# Patient Record
Sex: Female | Born: 1975 | Race: Black or African American | Hispanic: No | Marital: Single | Smoking: Never smoker
Health system: Southern US, Community
[De-identification: ages and names within clinical notes are randomized; demographics above are authoritative.]

## PROBLEM LIST (undated history)

## (undated) DIAGNOSIS — R569 Unspecified convulsions: Secondary | ICD-10-CM

## (undated) DIAGNOSIS — I10 Essential (primary) hypertension: Secondary | ICD-10-CM

## (undated) HISTORY — DX: Essential (primary) hypertension: I10

## (undated) HISTORY — DX: Unspecified convulsions: R56.9

---

## 1996-04-13 HISTORY — PX: TUBAL LIGATION: SHX77

## 2013-05-22 ENCOUNTER — Other Ambulatory Visit: Payer: Self-pay | Admitting: Neurology

## 2013-05-26 ENCOUNTER — Ambulatory Visit: Payer: Self-pay | Admitting: Nurse Practitioner

## 2013-09-21 ENCOUNTER — Encounter: Payer: Self-pay | Admitting: Nurse Practitioner

## 2013-09-22 ENCOUNTER — Encounter: Payer: Self-pay | Admitting: Nurse Practitioner

## 2013-09-22 ENCOUNTER — Encounter (INDEPENDENT_AMBULATORY_CARE_PROVIDER_SITE_OTHER): Payer: Self-pay

## 2013-09-22 ENCOUNTER — Ambulatory Visit (INDEPENDENT_AMBULATORY_CARE_PROVIDER_SITE_OTHER): Payer: Medicaid Other | Admitting: Nurse Practitioner

## 2013-09-22 VITALS — BP 163/102 | HR 86 | Ht 66.5 in | Wt 177.0 lb

## 2013-09-22 DIAGNOSIS — Z5181 Encounter for therapeutic drug level monitoring: Secondary | ICD-10-CM

## 2013-09-22 DIAGNOSIS — G40909 Epilepsy, unspecified, not intractable, without status epilepticus: Secondary | ICD-10-CM

## 2013-09-22 DIAGNOSIS — R569 Unspecified convulsions: Secondary | ICD-10-CM | POA: Insufficient documentation

## 2013-09-22 DIAGNOSIS — G40309 Generalized idiopathic epilepsy and epileptic syndromes, not intractable, without status epilepticus: Secondary | ICD-10-CM | POA: Insufficient documentation

## 2013-09-22 MED ORDER — OXCARBAZEPINE 300 MG PO TABS: 900.0000 mg | ORAL_TABLET | Freq: Two times a day (BID) | ORAL | Status: DC

## 2013-09-22 NOTE — Progress Notes (Signed)
[facility]  PATIENT: Samantha Pittman DOB: 1975/05/06   REASON FOR VISIT: follow up for seizure disorder   HISTORY OF PRESENT ILLNESS: Samantha Pittman, 38 year old female returns for followup. She was last seen and initially  evaluated by Samantha Pittman 04/22/2012. She has had one seizure event since that time. She claims she has been compliant with her medication. She is currently on Trileptal without side effects. She denies any falls or balance issues. She does have hypertension and was recently taken off of her medication. She is keeping a record of her B/P.  There is a strong family history of hypertension. She returns for reevaluation. She has no new neurologic complaints.  HISTORY: She had a history of epilepsy since 57 month old, also had a past medical history of hypertension, She had a generalized tonic-clonic seizure from 8 months to 9 years old, she was treated with phenobarbital then, because she was seizure-freesince 38 years of age, she was tapered off anti-leptic medication, until she has recurrent seizures again at age 61, she was treated with phenobarbital, later Dilantin, develops allergic reaction including rash and high fever, later she was treated with Tegretol for a few years,  about 4 years ago she was switched to Trileptal currently taking 300 mg 3 tablets twice a day, generic oxcarbazepine, she was under local neurologist care Samantha Pittman, who has since left the practice last visit was 2 years ago  She had only has very rare recurrent seizure, last seizure was 4 years ago, reported normal MRI of the brain, EEG, she is in school, lives with her children, drives,   REVIEW OF SYSTEMS: Full 14 system review of systems performed and notable only for those listed, all others are neg:  Constitutional: N/A  Cardiovascular: N/A  Ear/Nose/Throat: N/A  Skin: N/A  Eyes: N/A  Respiratory: N/A  Gastroitestinal: N/A  Hematology/Lymphatic: N/A  Endocrine:  N/A Musculoskeletal: Joint pain Allergy/Immunology: N/A  Neurological: 1 seizure Psychiatric: N/A Sleep : NA   ALLERGIES: Allergies  Allergen Reactions  . Dilantin [Phenytoin Sodium Extended]     HOME MEDICATIONS: Outpatient Prescriptions Prior to Visit  Medication Sig Dispense Refill  . Oxcarbazepine (TRILEPTAL) 300 MG tablet TAKE 3 TABLETS BY MOUTH TWICE DAILY  180 tablet  4  . lisinopril (PRINIVIL,ZESTRIL) 20 MG tablet Take 20 mg by mouth daily.       No facility-administered medications prior to visit.    PAST MEDICAL HISTORY: Past Medical History  Diagnosis Date  . High blood pressure     PAST SURGICAL HISTORY: Past Surgical History  Procedure Laterality Date  . Tubal ligation  1998    FAMILY HISTORY: Family History  Problem Relation Age of Onset  . Diabetes    . High blood pressure      SOCIAL HISTORY: History   Social History  . Marital Status: Single    Spouse Name: N/A    Number of Children: 2  . Years of Education: 12+   Occupational History  . unemployed     Social History Main Topics  . Smoking status: Never Smoker   . Smokeless tobacco: Never Used  . Alcohol Use: No  . Drug Use: No  . Sexual Activity: Not on file   Other Topics Concern  . Not on file   Social History Narrative   Patient lives at home with family.    Patient has a college education.   Patient has 2 children.      PHYSICAL EXAM  Filed  Vitals:   09/22/13 0920  BP: 163/102  Pulse: 86  Height: 5' 6.5" (1.689 m)  Weight: 177 lb (80.287 kg)   Body mass index is 28.14 kg/(m^2).  Generalized: Well developed, in no acute distress  Head: normocephalic and atraumatic,. Oropharynx benign  Neck: Supple, no carotid bruits  Cardiac: Regular rate rhythm, no murmur  Musculoskeletal: No deformity   Neurological examination   Mentation: Alert oriented to time, place, history taking. Follows all commands speech and language fluent  Cranial nerve II-XII: Fundoscopic  exam reveals sharp disc margins.Pupils were equal round reactive to light extraocular movements were full, visual field were full on confrontational test. Facial sensation and strength were normal. hearing was intact to finger rubbing bilaterally. Uvula tongue midline. head turning and shoulder shrug were normal and symmetric.Tongue protrusion into cheek strength was normal. Motor: normal bulk and tone, full strength in the BUE, BLE,  No focal weakness Sensory: normal and symmetric to light touch, pinprick, and  vibration  Coordination: finger-nose-finger, heel-to-shin bilaterally, no dysmetria Reflexes: Brachioradialis 2/2, biceps 2/2, triceps 2/2, patellar 2/2, Achilles 2/2, plantar responses were flexor bilaterally. Gait and Station: Rising up from seated position without assistance, normal stance,  moderate stride, good arm swing, smooth turning, able to perform tiptoe, and heel walking without difficulty. Tandem gait is steady  DIAGNOSTIC DATA (LABS, IMAGING, TESTING) -   ASSESSMENT AND PLAN  38 y.o. year old female  has a past medical history of High blood pressure. and generalized seizure disorder here to followup. She has had one seizure in the last 18 months. She has been compliant with her medication  Continue Trileptal at current dose will refill Check with Primary care about elevated blood pressure Trough level Trileptal  next week along with CBC, CMP Followup yearly and when necessary Call for any seizure activity Samantha Pittman, [facility], [facility], [facility] [address] [place], [place] [address] [phone]

## 2013-09-22 NOTE — Patient Instructions (Signed)
Continue Trileptal at current dose will refill Check with Primary care about elevated blood pressure Trough level last next week Followup yearly and when necessary Call for any seizure activity

## 2014-09-24 ENCOUNTER — Ambulatory Visit: Payer: Medicaid Other | Admitting: Nurse Practitioner

## 2014-10-04 ENCOUNTER — Ambulatory Visit (INDEPENDENT_AMBULATORY_CARE_PROVIDER_SITE_OTHER): Payer: Medicaid Other | Admitting: Nurse Practitioner

## 2014-10-04 ENCOUNTER — Encounter: Payer: Self-pay | Admitting: Nurse Practitioner

## 2014-10-04 ENCOUNTER — Telehealth: Payer: Self-pay | Admitting: *Deleted

## 2014-10-04 VITALS — BP 192/132 | HR 96 | Ht 66.0 in | Wt 185.2 lb

## 2014-10-04 DIAGNOSIS — G40309 Generalized idiopathic epilepsy and epileptic syndromes, not intractable, without status epilepticus: Secondary | ICD-10-CM

## 2014-10-04 DIAGNOSIS — Z5181 Encounter for therapeutic drug level monitoring: Secondary | ICD-10-CM

## 2014-10-04 MED ORDER — OXCARBAZEPINE 300 MG PO TABS: 900.0000 mg | ORAL_TABLET | Freq: Two times a day (BID) | ORAL | Status: DC

## 2014-10-04 NOTE — Patient Instructions (Signed)
Continue Trileptal at current dose will refill Check labs today Follow-up with primary care about blood pressure Follow-up yearly and when necessary Call for any seizure activity

## 2014-10-04 NOTE — Progress Notes (Signed)
[facility]  PATIENT: Samantha Pittman DOB: Jan 05, 1976   REASON FOR VISIT: Follow-up for seizure disorder  HISTORY FROM: Patient    HISTORY OF PRESENT ILLNESS:Ms. 78, 39 year old female returns for followup. She was last seen 09/22/2013 . No seizure events since last seen. She claims she is compliant with her medication. She did not get her labs after her last visit. Her blood pressure is noted to be elevated today and she says she has been off of her blood pressure medicine for about a year. She does have a history of hypertension She denies any headaches  She is currently on Trileptal without side effects. She denies any falls or balance issues.  She returns for reevaluation. She has no new neurologic complaints.  HISTORY: She had a history of epilepsy since 2 month old, also had a past medical history of hypertension, She had a generalized tonic-clonic seizure from 8 months to 69 years old, she was treated with phenobarbital then, because she was seizure-freesince 39 years of age, she was tapered off anti-leptic medication, until she has recurrent seizures again at age 57, she was treated with phenobarbital, later Dilantin, develops allergic reaction including rash and high fever, later she was treated with Tegretol for a few years,  about 4 years ago she was switched to Trileptal currently taking 300 mg 3 tablets twice a day, generic oxcarbazepine, she was under local neurologist care doctor Tesefay, who has since left the practice last visit was 2 years ago  She had only has very rare recurrent seizure, last seizure was 4 years ago, reported normal MRI of the brain, EEG, she is in school, lives with her children, drives,   REVIEW OF SYSTEMS: Full 14 system review of systems performed and notable only for those listed, all others are neg:  Constitutional: neg  Cardiovascular: Leg swelling  Ear/Nose/Throat: neg  Skin: neg Eyes: neg Respiratory:  neg Gastroitestinal: neg  Hematology/Lymphatic: neg  Endocrine: neg Musculoskeletal: Joint pain Allergy/Immunology: neg Neurological: neg Psychiatric: neg Sleep : neg   ALLERGIES: Allergies  Allergen Reactions  . Dilantin [Phenytoin Sodium Extended]     HOME MEDICATIONS: Outpatient Prescriptions Prior to Visit  Medication Sig Dispense Refill  . Oxcarbazepine (TRILEPTAL) 300 MG tablet Take 3 tablets (900 mg total) by mouth 2 (two) times daily. 180 tablet 11   No facility-administered medications prior to visit.    PAST MEDICAL HISTORY: Past Medical History  Diagnosis Date  . High blood pressure   . Seizures     PAST SURGICAL HISTORY: Past Surgical History  Procedure Laterality Date  . Tubal ligation  1998    FAMILY HISTORY: Family History  Problem Relation Age of Onset  . Diabetes    . High blood pressure      SOCIAL HISTORY: History   Social History  . Marital Status: Single    Spouse Name: N/A  . Number of Children: 2  . Years of Education: 12+   Occupational History  . unemployed     Social History Main Topics  . Smoking status: Never Smoker   . Smokeless tobacco: Never Used  . Alcohol Use: No  . Drug Use: No  . Sexual Activity: Not on file   Other Topics Concern  . Not on file   Social History Narrative   Patient lives at home with family.    Patient has a college education.   Patient has 2 children.    Started new job, standing on cement.  10-04-14  PHYSICAL EXAM  Filed Vitals:   10/04/14 1003 10/04/14 1007  BP: 196/122 192/132  Pulse: 96   Height: 5\' 6"  (1.676 m)   Weight: 185 lb 3.2 oz (84.006 kg)    Body mass index is 29.91 kg/(m^2). Generalized: Well developed, in no acute distress  Head: normocephalic and atraumatic,. Oropharynx benign  Neck: Supple, no carotid bruits  Cardiac: Regular rate rhythm, no murmur  Musculoskeletal: No deformity   Neurological examination   Mentation: Alert oriented to time, place,  history taking. Follows all commands speech and language fluent  Cranial nerve II-XII: Fundoscopic exam reveals sharp disc margins.Pupils were equal round reactive to light extraocular movements were full, visual field were full on confrontational test. Facial sensation and strength were normal. hearing was intact to finger rubbing bilaterally. Uvula tongue midline. head turning and shoulder shrug were normal and symmetric.Tongue protrusion into cheek strength was normal. Motor: normal bulk and tone, full strength in the BUE, BLE, No focal weakness Sensory: normal and symmetric to light touch, pinprick, and vibration  Coordination: finger-nose-finger, heel-to-shin bilaterally, no dysmetria Reflexes: Brachioradialis 2/2, biceps 2/2, triceps 2/2, patellar 2/2, Achilles 2/2, plantar responses were flexor bilaterally. Gait and Station: Rising up from seated position without assistance, normal stance, moderate stride, good arm swing, smooth turning, able to perform tiptoe, and heel walking without difficulty. Tandem gait is steady   DIAGNOSTIC DATA (LABS, IMAGING, TESTING) - ASSESSMENT AND PLAN  39 y.o. year old female  has a past medical history of High blood pressure and generalized seizure disorder here to follow-up. She is currently on Trileptal without additional seizures. Blood pressure elevated today in the office. Will send note to primary care  Continue Trileptal at current dose will refill Check labs today, CBC, BMP and Trileptal level Follow-up with primary care about blood pressure first reading 192/122, after 10 minutes 180/110 Follow-up yearly and when necessary Call for any seizure activity Dennie Bible, [facility], [facility], [facility] [address], [place], [place] [address] [phone]

## 2014-10-04 NOTE — Telephone Encounter (Signed)
I will forward note once finished to them [phone].  This is regarding elevated Bp.

## 2014-10-05 NOTE — Telephone Encounter (Signed)
I called and LMVM for pt on [phone] that did fax over last ofv note relating to Bp elevation.  Reiterated to call and make appt with them.

## 2014-10-05 NOTE — Telephone Encounter (Signed)
Faxed note to [facility].  [phone].

## 2014-10-07 LAB — BASIC METABOLIC PANEL
BUN / CREAT RATIO: 8 — AB (ref 9–23)
BUN: 8 mg/dL (ref 6–24)
CHLORIDE: 101 mmol/L (ref 97–108)
CO2: 25 mmol/L (ref 18–29)
Calcium: 9.8 mg/dL (ref 8.7–10.2)
Creatinine, Ser: 0.96 mg/dL (ref 0.57–1.00)
GFR calc Af Amer: 86 mL/min/{1.73_m2} (ref >59)
GFR calc non Af Amer: 74 mL/min/{1.73_m2} (ref >59)
GLUCOSE: 63 mg/dL — AB (ref 65–99)
Potassium: 4.8 mmol/L (ref 3.5–5.2)
Sodium: 140 mmol/L (ref 134–144)

## 2014-10-07 LAB — CBC WITH DIFFERENTIAL/PLATELET
BASOS ABS: 0.1 10*3/uL (ref 0.0–0.2)
Basos: 1 %
EOS (ABSOLUTE): 0.1 10*3/uL (ref 0.0–0.4)
Eos: 1 %
HEMATOCRIT: 39.2 % (ref 34.0–46.6)
Hemoglobin: 12.7 g/dL (ref 11.1–15.9)
IMMATURE GRANULOCYTES: 0 %
Immature Grans (Abs): 0 10*3/uL (ref 0.0–0.1)
LYMPHS: 42 %
Lymphocytes Absolute: 1.7 10*3/uL (ref 0.7–3.1)
MCH: 28.5 pg (ref 26.6–33.0)
MCHC: 32.4 g/dL (ref 31.5–35.7)
MCV: 88 fL (ref 79–97)
Monocytes Absolute: 0.3 10*3/uL (ref 0.1–0.9)
Monocytes: 8 %
NEUTROS PCT: 48 %
Neutrophils Absolute: 2 10*3/uL (ref 1.4–7.0)
Platelets: 281 10*3/uL (ref 150–379)
RBC: 4.45 x10E6/uL (ref 3.77–5.28)
RDW: 14.4 % (ref 12.3–15.4)
WBC: 4.1 10*3/uL (ref 3.4–10.8)

## 2014-10-07 LAB — 10-HYDROXYCARBAZEPINE: Oxcarbazepine SerPl-Mcnc: 45 ug/mL — ABNORMAL HIGH (ref 10–35)

## 2014-10-09 NOTE — Progress Notes (Signed)
I have reviewed and agreed above plan. 

## 2014-10-11 ENCOUNTER — Telehealth: Payer: Self-pay | Admitting: *Deleted

## 2014-10-11 NOTE — Progress Notes (Signed)
Quick Note:  I called and spoke to pt and relayed the results of her lab tests. She is aware took her medication prior to having labs drawn. No changes. She did make it to her pcp and I will fax results of these labs to them. She goes back in one month for recheck on her Bp meds. ______

## 2014-10-11 NOTE — Telephone Encounter (Signed)
Faxed to Dr. Wenda Overland the lab results.

## 2015-08-12 ENCOUNTER — Telehealth: Payer: Self-pay | Admitting: *Deleted

## 2015-08-12 MED ORDER — OXCARBAZEPINE 300 MG PO TABS: 900.0000 mg | ORAL_TABLET | Freq: Two times a day (BID) | ORAL | Status: DC

## 2015-08-12 NOTE — Telephone Encounter (Signed)
Pt has appt 10-04-15 with CM/ NP.  Refilled for 2 months.

## 2015-10-04 ENCOUNTER — Ambulatory Visit: Payer: Medicaid Other | Admitting: Nurse Practitioner

## 2015-10-07 ENCOUNTER — Encounter: Payer: Self-pay | Admitting: Nurse Practitioner

## 2015-11-14 ENCOUNTER — Other Ambulatory Visit: Payer: Self-pay | Admitting: Nurse Practitioner

## 2015-12-02 ENCOUNTER — Ambulatory Visit: Payer: Medicaid Other | Admitting: Nurse Practitioner

## 2016-02-11 ENCOUNTER — Inpatient Hospital Stay (HOSPITAL_COMMUNITY)
Admission: EM | Admit: 2016-02-11 | Discharge: 2016-03-13 | DRG: 064 | Disposition: E | Payer: Medicaid Other | Source: Other Acute Inpatient Hospital | Attending: Internal Medicine | Admitting: Internal Medicine

## 2016-02-11 ENCOUNTER — Inpatient Hospital Stay (HOSPITAL_COMMUNITY): Payer: Medicaid Other

## 2016-02-11 DIAGNOSIS — N179 Acute kidney failure, unspecified: Secondary | ICD-10-CM

## 2016-02-11 DIAGNOSIS — E875 Hyperkalemia: Secondary | ICD-10-CM | POA: Diagnosis not present

## 2016-02-11 DIAGNOSIS — R6521 Severe sepsis with septic shock: Secondary | ICD-10-CM | POA: Diagnosis present

## 2016-02-11 DIAGNOSIS — J9600 Acute respiratory failure, unspecified whether with hypoxia or hypercapnia: Secondary | ICD-10-CM | POA: Diagnosis present

## 2016-02-11 DIAGNOSIS — N170 Acute kidney failure with tubular necrosis: Secondary | ICD-10-CM | POA: Diagnosis present

## 2016-02-11 DIAGNOSIS — R402434 Glasgow coma scale score 3-8, 24 hours or more after hospital admission: Secondary | ICD-10-CM | POA: Diagnosis not present

## 2016-02-11 DIAGNOSIS — N63 Unspecified lump in unspecified breast: Secondary | ICD-10-CM

## 2016-02-11 DIAGNOSIS — I6340 Cerebral infarction due to embolism of unspecified cerebral artery: Secondary | ICD-10-CM | POA: Diagnosis present

## 2016-02-11 DIAGNOSIS — Z9911 Dependence on respirator [ventilator] status: Secondary | ICD-10-CM

## 2016-02-11 DIAGNOSIS — G9340 Encephalopathy, unspecified: Secondary | ICD-10-CM

## 2016-02-11 DIAGNOSIS — Z452 Encounter for adjustment and management of vascular access device: Secondary | ICD-10-CM

## 2016-02-11 DIAGNOSIS — I639 Cerebral infarction, unspecified: Secondary | ICD-10-CM

## 2016-02-11 DIAGNOSIS — R34 Anuria and oliguria: Secondary | ICD-10-CM | POA: Diagnosis not present

## 2016-02-11 DIAGNOSIS — G936 Cerebral edema: Secondary | ICD-10-CM | POA: Diagnosis not present

## 2016-02-11 DIAGNOSIS — I6783 Posterior reversible encephalopathy syndrome: Secondary | ICD-10-CM | POA: Diagnosis not present

## 2016-02-11 DIAGNOSIS — I161 Hypertensive emergency: Secondary | ICD-10-CM | POA: Diagnosis present

## 2016-02-11 DIAGNOSIS — R4020 Unspecified coma: Secondary | ICD-10-CM

## 2016-02-11 DIAGNOSIS — C50919 Malignant neoplasm of unspecified site of unspecified female breast: Secondary | ICD-10-CM

## 2016-02-11 DIAGNOSIS — G40909 Epilepsy, unspecified, not intractable, without status epilepticus: Secondary | ICD-10-CM | POA: Diagnosis present

## 2016-02-11 DIAGNOSIS — Z515 Encounter for palliative care: Secondary | ICD-10-CM | POA: Diagnosis not present

## 2016-02-11 DIAGNOSIS — R569 Unspecified convulsions: Secondary | ICD-10-CM | POA: Diagnosis present

## 2016-02-11 DIAGNOSIS — D638 Anemia in other chronic diseases classified elsewhere: Secondary | ICD-10-CM | POA: Diagnosis present

## 2016-02-11 DIAGNOSIS — I63039 Cerebral infarction due to thrombosis of unspecified carotid artery: Secondary | ICD-10-CM

## 2016-02-11 DIAGNOSIS — I6309 Cerebral infarction due to thrombosis of other precerebral artery: Secondary | ICD-10-CM

## 2016-02-11 DIAGNOSIS — I6300 Cerebral infarction due to thrombosis of unspecified precerebral artery: Secondary | ICD-10-CM | POA: Diagnosis present

## 2016-02-11 DIAGNOSIS — N186 End stage renal disease: Secondary | ICD-10-CM | POA: Diagnosis present

## 2016-02-11 DIAGNOSIS — Z978 Presence of other specified devices: Secondary | ICD-10-CM

## 2016-02-11 DIAGNOSIS — N1330 Unspecified hydronephrosis: Secondary | ICD-10-CM

## 2016-02-11 DIAGNOSIS — Z66 Do not resuscitate: Secondary | ICD-10-CM | POA: Diagnosis not present

## 2016-02-11 DIAGNOSIS — E872 Acidosis: Secondary | ICD-10-CM | POA: Diagnosis present

## 2016-02-11 DIAGNOSIS — A409 Streptococcal sepsis, unspecified: Secondary | ICD-10-CM | POA: Diagnosis not present

## 2016-02-11 DIAGNOSIS — I674 Hypertensive encephalopathy: Secondary | ICD-10-CM | POA: Diagnosis present

## 2016-02-11 DIAGNOSIS — I959 Hypotension, unspecified: Secondary | ICD-10-CM | POA: Diagnosis not present

## 2016-02-11 DIAGNOSIS — I248 Other forms of acute ischemic heart disease: Secondary | ICD-10-CM | POA: Diagnosis not present

## 2016-02-11 DIAGNOSIS — C7931 Secondary malignant neoplasm of brain: Secondary | ICD-10-CM | POA: Diagnosis not present

## 2016-02-11 DIAGNOSIS — I6529 Occlusion and stenosis of unspecified carotid artery: Secondary | ICD-10-CM | POA: Diagnosis not present

## 2016-02-11 DIAGNOSIS — I120 Hypertensive chronic kidney disease with stage 5 chronic kidney disease or end stage renal disease: Secondary | ICD-10-CM | POA: Diagnosis not present

## 2016-02-11 DIAGNOSIS — D696 Thrombocytopenia, unspecified: Secondary | ICD-10-CM | POA: Diagnosis present

## 2016-02-11 DIAGNOSIS — J9601 Acute respiratory failure with hypoxia: Secondary | ICD-10-CM

## 2016-02-11 DIAGNOSIS — C50812 Malignant neoplasm of overlapping sites of left female breast: Secondary | ICD-10-CM | POA: Diagnosis not present

## 2016-02-11 LAB — CBC WITH DIFFERENTIAL/PLATELET
Basophils Absolute: 0 10*3/uL (ref 0.0–0.1)
Basophils Relative: 0 %
EOS ABS: 0 10*3/uL (ref 0.0–0.7)
Eosinophils Relative: 0 %
HEMATOCRIT: 24.7 % — AB (ref 36.0–46.0)
HEMOGLOBIN: 8.2 g/dL — AB (ref 12.0–15.0)
LYMPHS ABS: 0.8 10*3/uL (ref 0.7–4.0)
Lymphocytes Relative: 5 %
MCH: 29.5 pg (ref 26.0–34.0)
MCHC: 33.2 g/dL (ref 30.0–36.0)
MCV: 88.8 fL (ref 78.0–100.0)
Monocytes Absolute: 0.8 10*3/uL (ref 0.1–1.0)
Monocytes Relative: 5 %
NEUTROS ABS: 15.3 10*3/uL — AB (ref 1.7–7.7)
NEUTROS PCT: 90 %
Platelets: 130 10*3/uL — ABNORMAL LOW (ref 150–400)
RBC: 2.78 MIL/uL — AB (ref 3.87–5.11)
RDW: 16.6 % — ABNORMAL HIGH (ref 11.5–15.5)
WBC: 16.8 10*3/uL — AB (ref 4.0–10.5)

## 2016-02-11 LAB — DIC (DISSEMINATED INTRAVASCULAR COAGULATION)PANEL
Platelets: 133 10*3/uL — ABNORMAL LOW (ref 150–400)
aPTT: 31 seconds (ref 24–36)

## 2016-02-11 LAB — COMPREHENSIVE METABOLIC PANEL
ALBUMIN: 3.1 g/dL — AB (ref 3.5–5.0)
ALT: 18 U/L (ref 14–54)
AST: 29 U/L (ref 15–41)
Alkaline Phosphatase: 100 U/L (ref 38–126)
Anion gap: 14 (ref 5–15)
BUN: 56 mg/dL — AB (ref 6–20)
CHLORIDE: 104 mmol/L (ref 101–111)
CO2: 20 mmol/L — AB (ref 22–32)
CREATININE: 6.47 mg/dL — AB (ref 0.44–1.00)
Calcium: 8.3 mg/dL — ABNORMAL LOW (ref 8.9–10.3)
GFR calc Af Amer: 8 mL/min — ABNORMAL LOW (ref >60)
GFR calc non Af Amer: 7 mL/min — ABNORMAL LOW (ref >60)
GLUCOSE: 136 mg/dL — AB (ref 65–99)
POTASSIUM: 4.1 mmol/L (ref 3.5–5.1)
SODIUM: 138 mmol/L (ref 135–145)
Total Bilirubin: 0.4 mg/dL (ref 0.3–1.2)
Total Protein: 6.3 g/dL — ABNORMAL LOW (ref 6.5–8.1)

## 2016-02-11 LAB — DIC (DISSEMINATED INTRAVASCULAR COAGULATION) PANEL
D DIMER QUANT: 3.1 ug{FEU}/mL — AB (ref 0.00–0.50)
FIBRINOGEN: 492 mg/dL — AB (ref 210–475)
INR: 1.17
PROTHROMBIN TIME: 15 s (ref 11.4–15.2)

## 2016-02-11 LAB — BLOOD GAS, ARTERIAL
ACID-BASE DEFICIT: 6.4 mmol/L — AB (ref 0.0–2.0)
BICARBONATE: 17.6 mmol/L — AB (ref 20.0–28.0)
Drawn by: 39899
FIO2: 40
LHR: 12 {breaths}/min
MECHVT: 400 mL
O2 SAT: 99.4 %
PEEP/CPAP: 5 cmH2O
Patient temperature: 98.6
pCO2 arterial: 29.6 mmHg — ABNORMAL LOW (ref 32.0–48.0)
pH, Arterial: 7.392 (ref 7.350–7.450)
pO2, Arterial: 169 mmHg — ABNORMAL HIGH (ref 83.0–108.0)

## 2016-02-11 LAB — TROPONIN I
Troponin I: 1.22 ng/mL (ref <0.03)
Troponin I: 1.88 ng/mL (ref <0.03)

## 2016-02-11 LAB — PROTIME-INR
INR: 1.22
PROTHROMBIN TIME: 15.5 s — AB (ref 11.4–15.2)

## 2016-02-11 LAB — LACTATE DEHYDROGENASE: LDH: 631 U/L — ABNORMAL HIGH (ref 98–192)

## 2016-02-11 LAB — BRAIN NATRIURETIC PEPTIDE: B Natriuretic Peptide: 110.2 pg/mL — ABNORMAL HIGH (ref 0.0–100.0)

## 2016-02-11 LAB — SAVE SMEAR

## 2016-02-11 LAB — PHOSPHORUS: Phosphorus: 6.7 mg/dL — ABNORMAL HIGH (ref 2.5–4.6)

## 2016-02-11 LAB — MAGNESIUM: MAGNESIUM: 2.3 mg/dL (ref 1.7–2.4)

## 2016-02-11 LAB — MRSA PCR SCREENING: MRSA by PCR: NEGATIVE

## 2016-02-11 MED ORDER — ORAL CARE MOUTH RINSE: 15.0000 mL | Freq: Two times a day (BID) | OROMUCOSAL | Status: DC

## 2016-02-11 MED ORDER — ALBUTEROL SULFATE (2.5 MG/3ML) 0.083% IN NEBU: 2.5000 mg | INHALATION_SOLUTION | RESPIRATORY_TRACT | Status: DC | PRN

## 2016-02-11 MED ORDER — ORAL CARE MOUTH RINSE
15.0000 mL | OROMUCOSAL | Status: DC
  Administered 2016-02-11 – 2016-02-22 (×104): 15 mL via OROMUCOSAL

## 2016-02-11 MED ORDER — MIDAZOLAM HCL 2 MG/2ML IJ SOLN: 2.0000 mg | INTRAMUSCULAR | Status: DC | PRN

## 2016-02-11 MED ORDER — ASPIRIN 300 MG RE SUPP: 300.0000 mg | RECTAL | Status: AC

## 2016-02-11 MED ORDER — SODIUM CHLORIDE 0.9 % IV SOLN
INTRAVENOUS | Status: DC
  Administered 2016-02-11 – 2016-02-20 (×3): via INTRAVENOUS

## 2016-02-11 MED ORDER — CHLORHEXIDINE GLUCONATE 0.12 % MT SOLN
15.0000 mL | Freq: Two times a day (BID) | OROMUCOSAL | Status: DC
  Administered 2016-02-11 – 2016-02-22 (×22): 15 mL via OROMUCOSAL
  Filled 2016-02-11 (×3): qty 15

## 2016-02-11 MED ORDER — SODIUM CHLORIDE 0.9 % IV SOLN: 250.0000 mL | INTRAVENOUS | Status: DC | PRN

## 2016-02-11 MED ORDER — OXCARBAZEPINE 300 MG/5ML PO SUSP
900.0000 mg | Freq: Two times a day (BID) | ORAL | Status: DC
  Administered 2016-02-11 – 2016-02-22 (×22): 900 mg
  Filled 2016-02-11 (×24): qty 15

## 2016-02-11 MED ORDER — ASPIRIN 81 MG PO CHEW
324.0000 mg | CHEWABLE_TABLET | ORAL | Status: AC
  Administered 2016-02-11: 324 mg via ORAL
  Filled 2016-02-11: qty 4

## 2016-02-11 MED ORDER — FENTANYL CITRATE (PF) 100 MCG/2ML IJ SOLN: 100.0000 ug | INTRAMUSCULAR | Status: DC | PRN

## 2016-02-11 MED ORDER — ONDANSETRON HCL 4 MG/2ML IJ SOLN
4.0000 mg | Freq: Four times a day (QID) | INTRAMUSCULAR | Status: DC | PRN
Start: 2016-02-11 — End: 2016-02-13

## 2016-02-11 MED ORDER — NOREPINEPHRINE BITARTRATE 1 MG/ML IV SOLN
0.0000 ug/min | INTRAVENOUS | Status: DC
  Filled 2016-02-11: qty 4

## 2016-02-11 MED ORDER — ACETAMINOPHEN 325 MG PO TABS
650.0000 mg | ORAL_TABLET | ORAL | Status: DC | PRN
  Administered 2016-02-13 – 2016-02-14 (×2): 650 mg via ORAL
  Filled 2016-02-11 (×2): qty 2

## 2016-02-11 MED ORDER — FAMOTIDINE 20 MG PO TABS
20.0000 mg | ORAL_TABLET | Freq: Two times a day (BID) | ORAL | Status: DC
  Administered 2016-02-11 – 2016-02-12 (×2): 20 mg via ORAL
  Filled 2016-02-11 (×2): qty 1

## 2016-02-11 MED ORDER — CHLORHEXIDINE GLUCONATE 0.12% ORAL RINSE (MEDLINE KIT): 15.0000 mL | Freq: Two times a day (BID) | OROMUCOSAL | Status: DC

## 2016-02-11 NOTE — Progress Notes (Signed)
RT will not be able to help transport the pt to MRI due to significant staffing issues tonight. Planning for dayshift RT to help get pt to MRI tomorrow aroung 9/10am depending on MRI workload.

## 2016-02-11 NOTE — Consult Note (Signed)
NEURO HOSPITALIST CONSULT NOTE   Requestig physician: Dr. Titus Mould   Reason for Consult: subacute infract on [facility] and [facility]   History obtained from:  Chart and Family at bedside  HPI:                                                                                                                                          Samantha Pittman is an 40 yo female with hx of seizures and HTN presented to outside hospital with hypertensive urgency. BP was lowered quickly and had a seizure. 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 She was intubated for airway protection and transferred to [facility] for further evaluation.  Past Medical History:  Diagnosis Date  . High blood pressure   . Seizures     Past Surgical History:  Procedure Laterality Date  . TUBAL LIGATION  1998    Family History  Problem Relation Age of Onset  . Diabetes    . High blood pressure        Social History:  reports that she has never smoked. She has never used smokeless tobacco. She reports that she does not drink alcohol or use drugs.  Allergies  Allergen Reactions  . Dilantin [Phenytoin Sodium Extended]     MEDICATIONS:                                                                                                                     I have reviewed the patient's current medications.   ROS:  History obtained from chart review  General ROS: negative for - chills, fatigue, fever, night sweats, weight gain or weight loss Psychological ROS: negative for - behavioral disorder, hallucinations, memory difficulties, mood swings or suicidal ideation Ophthalmic ROS: negative for - blurry vision, double vision, eye pain or loss of vision ENT ROS: negative for - epistaxis, nasal  discharge, oral lesions, sore throat, tinnitus or vertigo Allergy and Immunology ROS: negative for - hives or itchy/watery eyes Hematological and Lymphatic ROS: negative for - bleeding problems, bruising or swollen lymph nodes Endocrine ROS: negative for - galactorrhea, hair pattern changes, polydipsia/polyuria or temperature intolerance Respiratory ROS: negative for - cough, hemoptysis, shortness of breath or wheezing Cardiovascular ROS: negative for - chest pain, dyspnea on exertion, edema or irregular heartbeat Gastrointestinal ROS: negative for - abdominal pain, diarrhea, hematemesis, nausea/vomiting or stool incontinence Genito-Urinary ROS: negative for - dysuria, hematuria, incontinence or urinary frequency/urgency Musculoskeletal ROS: negative for - joint swelling or muscular weakness Neurological ROS: as noted in HPI Dermatological ROS: negative for rash and skin lesion changes   Blood pressure 111/64, pulse 79, resp. rate 20, SpO2 100 %.   Neurologic Examination:                                                                                                      HEENT-  Normocephalic, no lesions, without obvious abnormality.  Normal external eye and conjunctiva.  Normal TM's bilaterally.  Normal auditory canals and external ears. Normal external nose, mucus membranes and septum.  Normal pharynx. Cardiovascular- regular rate and rhythm, S1, S2 normal, no murmur, click, rub or gallop, pulses palpable throughout   Lungs- chest clear, no wheezing, rales, normal symmetric air entry, Heart exam - S1, S2 normal, no murmur, no gallop, rate regular Abdomen- soft, non-tender; bowel sounds normal; no masses,  no organomegaly   Neurological Examination Mental Status: Comatose No eye opening to noxious stimuli Minimal withdrawal throughout to noxious stimuli Disconjugate gaze, R eye is exotropic  Pupils- R 38mm and reactive, L 11mm and sluggish Intact corneals + cough and gag + VOR's        Lab Results: Basic Metabolic Panel: No results for input(s): NA, K, CL, CO2, GLUCOSE, BUN, CREATININE, CALCIUM, MG, PHOS in the last 168 hours.  Liver Function Tests: No results for input(s): AST, ALT, ALKPHOS, BILITOT, PROT, ALBUMIN in the last 168 hours. No results for input(s): LIPASE, AMYLASE in the last 168 hours. No results for input(s): AMMONIA in the last 168 hours.  CBC: No results for input(s): WBC, NEUTROABS, HGB, HCT, MCV, PLT in the last 168 hours.  Cardiac Enzymes: No results for input(s): CKTOTAL, CKMB, CKMBINDEX, TROPONINI in the last 168 hours.  Lipid Panel: No results for input(s): CHOL, TRIG, HDL, CHOLHDL, VLDL, LDLCALC in the last 168 hours.  CBG: No results for input(s): GLUCAP in the last 168 hours.  Microbiology: No results found for this or any previous visit.  Coagulation Studies: No results for input(s): LABPROT, INR in the last 72 hours.  Imaging: No results found.      Assessment/Plan:  40  yo female with hx of seizures and HTN presented to outside hospital with hypertensive urgency. BP was lowered quickly and had a seizure. 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 She was intubated for airway protection and transferred to [place] for further evaluation.  - Recommend MRI brain, MRA head and neck to rule out PRES - After MRI please place continuous EEG  - Continue home dose of anti seizure meds - Trileptal level

## 2016-02-11 NOTE — Progress Notes (Signed)
Spoke with Ultrasound tech Darlene oncall at [phone].  MD/CCM will need to call [phone] and speak with a  Radiologist before ultrasound can be done. MD should then call ultrasound tech at [phone] with the radiologist's name so ultrasound tech can do ultrasound.

## 2016-02-11 NOTE — H&P (Signed)
PULMONARY / CRITICAL CARE MEDICINE   Name: Samantha Pittman MRN: [id number] DOB: December 21, 1975    ADMISSION DATE:  01/19/2016 CONSULTATION DATE:  02/01/2016  REFERRING MD:  Dr, Langley Gauss / [facility]   CHIEF COMPLAINT:  Weakness / Dizziness   HISTORY OF PRESENT ILLNESS:   40 y/o F with PMH of tubal ligation, HTN,seizures who presented to [facility] on 10/30 with a 6 day history of weakness, dizziness and difficulty seeing out of her left eye.    Initial evaluation found the patient to be significantly hypertensive with presenting pressure of 224/160 (MAP 81).  Initial labs Na 135, K 3.6, Cl 93, AG 24, BUN 36, sr cr 2.94, glucose 184, mag 2.5, AST 31/ALT 14, alk phos 136, TSH 0.37, WBC 11.3, Hgb 11, and platelets 51.  UA was negative.  She was treated with captopril, ativan, amlodipine and labetalol to reduce blood pressure.  The patient was admitted to [facility] for further evaluation.  She later had a seizure.  Per report, she was felt to be post ictal while on the medical floor.  Am of 10/31, she remained altered and pupils were unequal.  Follow up CT of the head was obtained which demonstrated an abnormal new hypodensity in the head of the right caudate nucleus extending into the right lentiform nucleus, acute or early subacute infarct in this vicinity.  The patient was intubated and transferred to [facility] for further evaluation.    PAST MEDICAL HISTORY :  She  has a past medical history of High blood pressure and Seizures.  PAST SURGICAL HISTORY: She  has a past surgical history that includes Tubal ligation (1998).  Allergies  Allergen Reactions  . Dilantin [Phenytoin Sodium Extended]     No current facility-administered medications on file prior to encounter.    Current Outpatient Prescriptions on File Prior to Encounter  Medication Sig  . Oxcarbazepine (TRILEPTAL) 300 MG tablet TAKE 3 TABLETS BY MOUTH 2 TIMES DAILY.    FAMILY HISTORY:  Her indicated  that her mother is alive. She indicated that her father is alive.    SOCIAL HISTORY: She  reports that she has never smoked. She has never used smokeless tobacco. She reports that she does not drink alcohol or use drugs.  REVIEW OF SYSTEMS:   Unable to complete as patient is altered on mechanical ventilation  SUBJECTIVE:   VITAL SIGNS: Pulse 84   Resp (!) 23   SpO2 100%   HEMODYNAMICS:    VENTILATOR SETTINGS: Vent Mode: PRVC FiO2 (%):  [60 %] 60 % Set Rate:  [12 bmp] 12 bmp Vt Set:  [400 mL] 400 mL PEEP:  [5 cmH20] 5 cmH20  INTAKE / OUTPUT: No intake/output data recorded.  PHYSICAL EXAMINATION: General:  Well developed young adult female in NAD on vent  Neuro:  Left pupil 3-4 mm & sluggish, R pupil 2-3 R, withdrawal to pain in LE's, minimal movement of UE's with pain, + gag, disconjugate gaze (right eye to right)  HEENT:  MM pink/moist, ETT in place Cardiovascular:  s1s2 rrr, no m/r/g Lungs:  Even/non-labored, lungs bilaterally clear  Abdomen:  Obese/soft, bsx4  Musculoskeletal:  No acute deformities  Skin:  Warm/dry, no edema, left breast mass with open area (approx 1 inch) under breast fold  LABS:  BMET No results for input(s): NA, K, CL, CO2, BUN, CREATININE, GLUCOSE in the last 168 hours.  Electrolytes No results for input(s): CALCIUM, MG, PHOS in the last 168 hours.  CBC No  results for input(s): WBC, HGB, HCT, PLT in the last 168 hours.  Coag's No results for input(s): APTT, INR in the last 168 hours.  Sepsis Markers No results for input(s): LATICACIDVEN, PROCALCITON, O2SATVEN in the last 168 hours.  ABG No results for input(s): PHART, PCO2ART, PO2ART in the last 168 hours.  Liver Enzymes No results for input(s): AST, ALT, ALKPHOS, BILITOT, ALBUMIN in the last 168 hours.  Cardiac Enzymes No results for input(s): TROPONINI, PROBNP in the last 168 hours.  Glucose No results for input(s): GLUCAP in the last 168 hours.  Imaging No results  found.   STUDIES:  MRI / MRA Head 10/31 >>  [place] L Breast 10/31 >>   CULTURES:   ANTIBIOTICS:   SIGNIFICANT EVENTS: 10/30  Admit to [facility] with weakness, dizziness.  Hypertensive emergency.  Seizure > AMS,  10/31  AMS continued, CT head worrisome for CVA, transferred to [facility]   LINES/TUBES: ETT 10/31 >>   DISCUSSION: 40 y/o F with PMH of HTN and seizures admitted to [facility] 10/30 with hypertensive emergency.  BP reduced.  Later had seizure / AMS.  Continued to be altered on 10/31 & CT head worrisome for CVA.  Tx to [facility] for further evaluation.    ASSESSMENT / PLAN:  PULMONARY A: Acute Respiratory Failure - in setting of neurologic dysfunction / inability to protect airway  P:   PRVC 8 cc/kg  Wean PEEP / FiO2 for sats > 92% CXR on arrival and in AM PRN albuterol   CARDIOVASCULAR A:  Hypertensive Emergency - initially, now hypotensive  Hypotension Hx HTN - not on medications as outpatient  P:  ICU monitoring of hemodynamics  Levophed for MAP >65, wean to off Trend troponin  ASA   RENAL A:   Acute Kidney Injury - presumed from HTN, no baseline sr cr for comparrison  P:   Trend BMP / UOP  Replace electrolytes as indicated   GASTROINTESTINAL A:   Vent Associated Dysphagia  Concern for CVA  P:   Pepcid for SUP  NPO for now  Consider TF in am if remains intubated   HEMATOLOGIC A:   Mild Anemia  Thrombocytopenia - given anemia, thrombocytopenia and AMS, raises concern for TTP R/O TTP  P:  Assess peripheral smear Assess DIC panel  Trend CBC  SCD's for now until MRI obtained  ASA as above   INFECTIOUS A:   No acute infectious process  P:   Monitor fever curve / WBC  ENDOCRINE A:   No acute issues   P:   Monitor glucose on BMP   NEUROLOGIC A:   Acute Encephalopathy - rule out CVA, PRES, TTP  Seizure - active and hx of  P:   RASS goal: 0 to -1  PRN fentanyl / versed only  Minimize sedation to allow for neuro exam Assess MRI/MRA  STAT Pending results of above, may need 3% (had mild edema on CT from OSH) Assess continuous EEG post MRI  Assess trileptal level  Other rec's per neuro   OTHER A: Breast Mass  P: Assess [place] left breast   FAMILY  - Updates: Mother and sister updated at bedside.   - Inter-disciplinary family meet or Palliative Care meeting due by:  11/6   Noe Gens, NP-C [place] Pgr: [phone] or if no answer [phone] 01/21/2016, 3:26 PM

## 2016-02-11 NOTE — Progress Notes (Signed)
EEG completed; results pending.    

## 2016-02-12 ENCOUNTER — Inpatient Hospital Stay (HOSPITAL_COMMUNITY): Payer: Medicaid Other

## 2016-02-12 DIAGNOSIS — I161 Hypertensive emergency: Secondary | ICD-10-CM

## 2016-02-12 DIAGNOSIS — D72829 Elevated white blood cell count, unspecified: Secondary | ICD-10-CM

## 2016-02-12 DIAGNOSIS — D696 Thrombocytopenia, unspecified: Secondary | ICD-10-CM

## 2016-02-12 DIAGNOSIS — I6339 Cerebral infarction due to thrombosis of other cerebral artery: Secondary | ICD-10-CM

## 2016-02-12 DIAGNOSIS — I6309 Cerebral infarction due to thrombosis of other precerebral artery: Secondary | ICD-10-CM

## 2016-02-12 DIAGNOSIS — R4182 Altered mental status, unspecified: Secondary | ICD-10-CM

## 2016-02-12 DIAGNOSIS — G9340 Encephalopathy, unspecified: Secondary | ICD-10-CM

## 2016-02-12 DIAGNOSIS — D599 Acquired hemolytic anemia, unspecified: Secondary | ICD-10-CM

## 2016-02-12 DIAGNOSIS — R928 Other abnormal and inconclusive findings on diagnostic imaging of breast: Secondary | ICD-10-CM

## 2016-02-12 DIAGNOSIS — N179 Acute kidney failure, unspecified: Secondary | ICD-10-CM

## 2016-02-12 DIAGNOSIS — G40909 Epilepsy, unspecified, not intractable, without status epilepticus: Secondary | ICD-10-CM

## 2016-02-12 LAB — TYPE AND SCREEN
ABO/RH(D): O POS
Antibody Screen: NEGATIVE

## 2016-02-12 LAB — URINALYSIS, ROUTINE W REFLEX MICROSCOPIC
Bilirubin Urine: NEGATIVE
Glucose, UA: NEGATIVE mg/dL
Ketones, ur: NEGATIVE mg/dL
Leukocytes, UA: NEGATIVE
Nitrite: NEGATIVE
Protein, ur: 100 mg/dL — AB
Specific Gravity, Urine: 1.014 (ref 1.005–1.030)
pH: 5.5 (ref 5.0–8.0)

## 2016-02-12 LAB — BASIC METABOLIC PANEL
Anion gap: 13 (ref 5–15)
BUN: 64 mg/dL — AB (ref 6–20)
CO2: 18 mmol/L — ABNORMAL LOW (ref 22–32)
CREATININE: 7.13 mg/dL — AB (ref 0.44–1.00)
Calcium: 8.4 mg/dL — ABNORMAL LOW (ref 8.9–10.3)
Chloride: 107 mmol/L (ref 101–111)
GFR calc Af Amer: 7 mL/min — ABNORMAL LOW (ref >60)
GFR, EST NON AFRICAN AMERICAN: 6 mL/min — AB (ref >60)
Glucose, Bld: 115 mg/dL — ABNORMAL HIGH (ref 65–99)
Potassium: 4.8 mmol/L (ref 3.5–5.1)
SODIUM: 138 mmol/L (ref 135–145)

## 2016-02-12 LAB — PHOSPHORUS: Phosphorus: 6.9 mg/dL — ABNORMAL HIGH (ref 2.5–4.6)

## 2016-02-12 LAB — RAPID URINE DRUG SCREEN, HOSP PERFORMED
AMPHETAMINES: NOT DETECTED
BARBITURATES: NOT DETECTED
Benzodiazepines: NOT DETECTED
Cocaine: NOT DETECTED
OPIATES: NOT DETECTED
TETRAHYDROCANNABINOL: NOT DETECTED

## 2016-02-12 LAB — TSH: TSH: 0.449 u[IU]/mL (ref 0.350–4.500)

## 2016-02-12 LAB — URINE MICROSCOPIC-ADD ON

## 2016-02-12 LAB — TROPONIN I
TROPONIN I: 2.23 ng/mL — AB (ref <0.03)
Troponin I: 2.04 ng/mL (ref <0.03)
Troponin I: 1.97 ng/mL (ref <0.03)
Troponin I: 1.35 ng/mL (ref <0.03)

## 2016-02-12 LAB — CBC
HCT: 22.9 % — ABNORMAL LOW (ref 36.0–46.0)
Hemoglobin: 7.7 g/dL — ABNORMAL LOW (ref 12.0–15.0)
MCH: 30.3 pg (ref 26.0–34.0)
MCHC: 33.6 g/dL (ref 30.0–36.0)
MCV: 90.2 fL (ref 78.0–100.0)
PLATELETS: 138 10*3/uL — AB (ref 150–400)
RBC: 2.54 MIL/uL — ABNORMAL LOW (ref 3.87–5.11)
RDW: 16.7 % — ABNORMAL HIGH (ref 11.5–15.5)
WBC: 13.5 10*3/uL — ABNORMAL HIGH (ref 4.0–10.5)

## 2016-02-12 LAB — VITAMIN B12: Vitamin B-12: 508 pg/mL (ref 180–914)

## 2016-02-12 LAB — GLUCOSE, CAPILLARY: Glucose-Capillary: 114 mg/dL — ABNORMAL HIGH (ref 65–99)

## 2016-02-12 LAB — CREATININE, URINE, RANDOM: Creatinine, Urine: 141.91 mg/dL

## 2016-02-12 LAB — SODIUM, URINE, RANDOM: SODIUM UR: 21 mmol/L

## 2016-02-12 LAB — PROCALCITONIN: PROCALCITONIN: 0.73 ng/mL

## 2016-02-12 LAB — MAGNESIUM: MAGNESIUM: 2.3 mg/dL (ref 1.7–2.4)

## 2016-02-12 LAB — ABO/RH: ABO/RH(D): O POS

## 2016-02-12 LAB — T4, FREE: FREE T4: 0.53 ng/dL — AB (ref 0.61–1.12)

## 2016-02-12 LAB — HAPTOGLOBIN: HAPTOGLOBIN: 69 mg/dL (ref 34–200)

## 2016-02-12 MED ORDER — FAMOTIDINE IN NACL 20-0.9 MG/50ML-% IV SOLN
20.0000 mg | INTRAVENOUS | Status: DC
  Administered 2016-02-13 – 2016-02-22 (×10): 20 mg via INTRAVENOUS
  Filled 2016-02-12 (×10): qty 50

## 2016-02-12 MED ORDER — MIDAZOLAM HCL 2 MG/2ML IJ SOLN
1.0000 mg | INTRAMUSCULAR | Status: DC | PRN
  Administered 2016-02-12: 2 mg via INTRAVENOUS
  Filled 2016-02-12: qty 2

## 2016-02-12 MED ORDER — FENTANYL CITRATE (PF) 100 MCG/2ML IJ SOLN: 25.0000 ug | INTRAMUSCULAR | Status: DC | PRN

## 2016-02-12 NOTE — Consult Note (Signed)
[place] NOTE  Patient Care Team: Zella Richer. Scotty Court, MD as PCP - General (Family Medicine)  CHIEF COMPLAINTS/PURPOSE OF CONSULTATION:  Thrombocytopenia, acute renal failure, stroke, rule out TTP  HISTORY OF PRESENTING ILLNESS:  Samantha Pittman 40 y.o. female is admitted to the hospital, transfer from outside hospital after presentation with altered mental status Family members are not present and the patient is sedated and intubated. I had the opportunity to review outside records. This patient has background history of seizure disorder and hypertension. Her baseline blood work on 10/04/2014 were within normal limits. According to outside records, the patient has stopped taking blood pressure medication for some time. When she presented to the emergency department locally, she was noted to have hypertensive crisis with blood pressure around 224/160 with associated weakness, dizziness and difficulties with changes in vision. At the time when the patient was seen in the emergency department, she was fully oriented. There were no reported recent fever or chills. The patient did not report any abnormal chest wall mass. On 02/10/2016, outside lab review hemoglobin of 11, white count of 11.3 and platelet count of 51,000. Coagulation studies were within normal limits but she was found to have acute renal failure with a creatinine of 2.9. Soon after the patient was admitted, she was subsequently found to be unresponsive with dilated left pupil and agonal breathing. She was resuscitated She underwent CT scan of the head in the outside facility which showed significant abnormalities suggestive of new acute infarct. The patient was subsequently intubated and as the patient was noted to be hypotensive, pressor support was started. At the time of resuscitation, she was noted to have evidence of a left fungating breast mass. She was subsequently transferred here to [facility] for  further evaluation. Surgeon after arrival to the emergency department here, her CBC was abnormal with hemoglobin 8.2, white blood cell count 16.8 and platelet count of 130,000. Her platelet count has improved today to 138,000, hemoglobin 7.7 and white blood cell count 13.5. Serum chemistries reveal worsening renal failure with creatinine of 7.1, elevated troponin of 2 and LDH elevated at 631. Total bilirubin was within normal limits  MEDICAL HISTORY:  Past Medical History:  Diagnosis Date  . High blood pressure   . Seizures     SURGICAL HISTORY: Past Surgical History:  Procedure Laterality Date  . TUBAL LIGATION  1998    SOCIAL HISTORY: Social History   Social History  . Marital status: Single    Spouse name: N/A  . Number of children: 2  . Years of education: 12+   Occupational History  . unemployed     Social History Main Topics  . Smoking status: Never Smoker  . Smokeless tobacco: Never Used  . Alcohol use No  . Drug use: No  . Sexual activity: Not on file   Other Topics Concern  . Not on file   Social History Narrative   Patient lives at home with family.    Patient has a college education.   Patient has 2 children.    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 Diabetes    . High blood pressure      ALLERGIES:  is allergic to dilantin [phenytoin sodium extended].  MEDICATIONS:  Current Facility-Administered Medications  Medication Dose Route Frequency Provider Last Rate Last Dose  . 0.9 %  sodium chloride infusion  250 mL  Intravenous PRN Omar Person, NP      . 0.9 %  sodium chloride infusion   Intravenous Continuous Omar Person, NP 10 mL/hr at 02/02/2016 2218    . acetaminophen (TYLENOL) tablet 650 mg  650 mg Oral Q4H PRN Omar Person, NP      . albuterol (PROVENTIL) (2.5 MG/3ML) 0.083% nebulizer solution 2.5 mg  2.5 mg Nebulization Q2H PRN Omar Person, NP       . chlorhexidine (PERIDEX) 0.12 % solution 15 mL  15 mL Mouth Rinse BID Raylene Miyamoto, MD   15 mL at 02/12/16 1443  . famotidine (PEPCID) IVPB 20 mg premix  20 mg Intravenous Q24H Javier Glazier, MD      . fentaNYL (SUBLIMAZE) injection 25-100 mcg  25-100 mcg Intravenous Q15 min PRN Javier Glazier, MD      . MEDLINE mouth rinse  15 mL Mouth Rinse 10 times per day Raylene Miyamoto, MD   15 mL at 02/12/16 1400  . midazolam (VERSED) injection 1-4 mg  1-4 mg Intravenous Q15 min PRN Javier Glazier, MD      . ondansetron [facility]) injection 4 mg  4 mg Intravenous Q6H PRN Omar Person, NP      . OXcarbazepine (TRILEPTAL) 300 MG/5ML suspension 900 mg  900 mg Per Tube BID Wilhelmina Mcardle, MD   900 mg at 02/12/16 1540    REVIEW OF SYSTEMS:  Unable to obtain   PHYSICAL EXAMINATION: ECOG PERFORMANCE STATUS: 4 - Bedbound  Vitals:   02/12/16 1435 02/12/16 1500  BP: (!) 144/84 (!) 154/94  Pulse: (!) 101 (!) 102  Resp: (!) 22 (!) 21  Temp:     Filed Weights   02/12/16 0228  Weight: 165 lb 2 oz (74.9 kg)    GENERAL:She is sedated, intubated and ventilated SKIN: skin color, texture, turgor are normal, no rashes or significant lesions EYES: Closed.  OROPHARYNX:no exudate, no erythema and lips, buccal mucosa, and tongue normal  NECK: supple, thyroid normal size, non-tender, without nodularity LYMPH:  no palpable lymphadenopathy in the cervical, axillary or inguinal LUNGS: clear to auscultation and percussion with normal breathing effort HEART: regular rate & rhythm and no murmurs and no lower extremity edema ABDOMEN:abdomen soft, non-tender and normal bowel sounds Musculoskeletal:no cyanosis of digits and no clubbing. She is noted to have a breast mass PSYCH: alert & oriented x 3 with fluent speech NEURO: no focal motor/sensory deficits  LABORATORY DATA:  I have reviewed the data as listed Recent Results (from the past 2160 hour(s))  MRSA PCR Screening     Status: None    Collection Time: 01/23/2016  3:19 PM  Result Value Ref Range   MRSA by PCR NEGATIVE NEGATIVE    Comment:        The GeneXpert MRSA Assay (FDA approved for NASAL specimens only), is one component of a comprehensive MRSA colonization surveillance program. It is not intended to diagnose MRSA infection nor to guide or monitor treatment for MRSA infections.   Blood gas, arterial     Status: Abnormal   Collection Time: 01/30/2016  4:25 PM  Result Value Ref Range   FIO2 40.00    Delivery systems VENTILATOR    Mode PRESSURE REGULATED VOLUME CONTROL    VT 400 mL   LHR 12.0 resp/min   Peep/cpap 5.0 cm H20   pH, Arterial 7.392 7.350 - 7.450   pCO2 arterial 29.6 (L) 32.0 - 48.0 mmHg   pO2,  Arterial 169 (H) 83.0 - 108.0 mmHg   Bicarbonate 17.6 (L) 20.0 - 28.0 mmol/L   Acid-base deficit 6.4 (H) 0.0 - 2.0 mmol/L   O2 Saturation 99.4 %   Patient temperature 98.6    Collection site RIGHT RADIAL    Drawn by [phone]    Sample type ARTERIAL DRAW    Allens test (pass/fail) PASS PASS  Comprehensive metabolic panel     Status: Abnormal   Collection Time: 01/15/2016  5:11 PM  Result Value Ref Range   Sodium 138 135 - 145 mmol/L   Potassium 4.1 3.5 - 5.1 mmol/L   Chloride 104 101 - 111 mmol/L   CO2 20 (L) 22 - 32 mmol/L   Glucose, Bld 136 (H) 65 - 99 mg/dL   BUN 56 (H) 6 - 20 mg/dL   Creatinine, Ser 6.47 (H) 0.44 - 1.00 mg/dL   Calcium 8.3 (L) 8.9 - 10.3 mg/dL   Total Protein 6.3 (L) 6.5 - 8.1 g/dL   Albumin 3.1 (L) 3.5 - 5.0 g/dL   AST 29 15 - 41 U/L   ALT 18 14 - 54 U/L   Alkaline Phosphatase 100 38 - 126 U/L   Total Bilirubin 0.4 0.3 - 1.2 mg/dL   GFR calc non Af Amer 7 (L) >60 mL/min   GFR calc Af Amer 8 (L) >60 mL/min    Comment: (NOTE) The eGFR has been calculated using the CKD EPI equation. This calculation has not been validated in all clinical situations. eGFR's persistently <60 mL/min signify possible Chronic Kidney Disease.    Anion gap 14 5 - 15  Magnesium     Status: None    Collection Time: 01/25/2016  5:11 PM  Result Value Ref Range   Magnesium 2.3 1.7 - 2.4 mg/dL  Phosphorus     Status: Abnormal   Collection Time: 01/23/2016  5:11 PM  Result Value Ref Range   Phosphorus 6.7 (H) 2.5 - 4.6 mg/dL  Brain natriuretic peptide     Status: Abnormal   Collection Time: 01/14/2016  5:11 PM  Result Value Ref Range   B Natriuretic Peptide 110.2 (H) 0.0 - 100.0 pg/mL  CBC WITH DIFFERENTIAL     Status: Abnormal   Collection Time: 01/27/2016  5:11 PM  Result Value Ref Range   WBC 16.8 (H) 4.0 - 10.5 K/uL   RBC 2.78 (L) 3.87 - 5.11 MIL/uL   Hemoglobin 8.2 (L) 12.0 - 15.0 g/dL   HCT 24.7 (L) 36.0 - 46.0 %   MCV 88.8 78.0 - 100.0 fL   MCH 29.5 26.0 - 34.0 pg   MCHC 33.2 30.0 - 36.0 g/dL   RDW 16.6 (H) 11.5 - 15.5 %   Platelets 130 (L) 150 - 400 K/uL   Neutrophils Relative % 90 %   Neutro Abs 15.3 (H) 1.7 - 7.7 K/uL   Lymphocytes Relative 5 %   Lymphs Abs 0.8 0.7 - 4.0 K/uL   Monocytes Relative 5 %   Monocytes Absolute 0.8 0.1 - 1.0 K/uL   Eosinophils Relative 0 %   Eosinophils Absolute 0.0 0.0 - 0.7 K/uL   Basophils Relative 0 %   Basophils Absolute 0.0 0.0 - 0.1 K/uL  Protime-INR     Status: Abnormal   Collection Time: 02/06/2016  5:11 PM  Result Value Ref Range   Prothrombin Time 15.5 (H) 11.4 - 15.2 seconds   INR 1.22   Troponin I     Status: Abnormal   Collection Time: 02/05/2016  5:11  PM  Result Value Ref Range   Troponin I 1.22 (HH) <0.03 ng/mL    Comment: CRITICAL RESULT CALLED TO, READ BACK BY AND VERIFIED WITH: Rich Fuchs 1830 02/10/2016 D BRADLEY   DIC (disseminated intravasc coag) panel     Status: Abnormal   Collection Time: 02/10/2016  5:11 PM  Result Value Ref Range   Prothrombin Time 15.0 11.4 - 15.2 seconds   INR 1.17    aPTT 31 24 - 36 seconds   Fibrinogen 492 (H) 210 - 475 mg/dL   D-Dimer, Quant 3.10 (H) 0.00 - 0.50 ug/mL-FEU    Comment: (NOTE) At the manufacturer cut-off of 0.50 ug/mL FEU, this assay has been documented to exclude PE with a  sensitivity and negative predictive value of 97 to 99%.  At this time, this assay has not been approved by the FDA to exclude DVT/VTE. Results should be correlated with clinical presentation.    Platelets 133 (L) 150 - 400 K/uL   Smear Review SCHISTOCYTES NOTED ON SMEAR   Save smear     Status: None   Collection Time: 01/18/2016  5:11 PM  Result Value Ref Range   Smear Review SCHISTOCYTES NOTED ON SMEAR   Haptoglobin     Status: None   Collection Time: 02/10/2016  5:11 PM  Result Value Ref Range   Haptoglobin 69 34 - 200 mg/dL    Comment: (NOTE) Performed At: [facility] [place], [place] [id number] Lindon Romp MD [id number]   Lactate dehydrogenase     Status: Abnormal   Collection Time: 02/08/2016  5:11 PM  Result Value Ref Range   LDH 631 (H) 98 - 192 U/L  Troponin I     Status: Abnormal   Collection Time: 01/26/2016 10:25 PM  Result Value Ref Range   Troponin I 1.88 (HH) <0.03 ng/mL    Comment: CRITICAL VALUE NOTED.  VALUE IS CONSISTENT WITH PREVIOUSLY REPORTED AND CALLED VALUE.  Troponin I     Status: Abnormal   Collection Time: 02/12/16  4:44 AM  Result Value Ref Range   Troponin I 2.04 (HH) <0.03 ng/mL    Comment: CRITICAL VALUE NOTED.  VALUE IS CONSISTENT WITH PREVIOUSLY REPORTED AND CALLED VALUE.  CBC     Status: Abnormal   Collection Time: 02/12/16  4:44 AM  Result Value Ref Range   WBC 13.5 (H) 4.0 - 10.5 K/uL   RBC 2.54 (L) 3.87 - 5.11 MIL/uL   Hemoglobin 7.7 (L) 12.0 - 15.0 g/dL   HCT 22.9 (L) 36.0 - 46.0 %   MCV 90.2 78.0 - 100.0 fL   MCH 30.3 26.0 - 34.0 pg   MCHC 33.6 30.0 - 36.0 g/dL   RDW 16.7 (H) 11.5 - 15.5 %   Platelets 138 (L) 150 - 400 K/uL  Basic metabolic panel     Status: Abnormal   Collection Time: 02/12/16  4:44 AM  Result Value Ref Range   Sodium 138 135 - 145 mmol/L   Potassium 4.8 3.5 - 5.1 mmol/L   Chloride 107 101 - 111 mmol/L   CO2 18 (L) 22 - 32 mmol/L   Glucose, Bld 115 (H) 65 - 99 mg/dL   BUN 64 (H) 6 -  20 mg/dL   Creatinine, Ser 7.13 (H) 0.44 - 1.00 mg/dL   Calcium 8.4 (L) 8.9 - 10.3 mg/dL   GFR calc non Af Amer 6 (L) >60 mL/min   GFR calc Af Amer 7 (L) >60 mL/min  Comment: (NOTE) The eGFR has been calculated using the CKD EPI equation. This calculation has not been validated in all clinical situations. eGFR's persistently <60 mL/min signify possible Chronic Kidney Disease.    Anion gap 13 5 - 15  Magnesium     Status: None   Collection Time: 02/12/16  4:44 AM  Result Value Ref Range   Magnesium 2.3 1.7 - 2.4 mg/dL  Phosphorus     Status: Abnormal   Collection Time: 02/12/16  4:44 AM  Result Value Ref Range   Phosphorus 6.9 (H) 2.5 - 4.6 mg/dL  Sodium, urine, random     Status: None   Collection Time: 02/12/16 10:22 AM  Result Value Ref Range   Sodium, Ur 21 mmol/L  Creatinine, urine, random     Status: None   Collection Time: 02/12/16 10:22 AM  Result Value Ref Range   Creatinine, Urine 141.91 mg/dL  Type and screen [place]     Status: None   Collection Time: 02/12/16 10:25 AM  Result Value Ref Range   ABO/RH(D) O POS    Antibody Screen NEG    Sample Expiration 02/15/2016   ABO/Rh     Status: None   Collection Time: 02/12/16 10:25 AM  Result Value Ref Range   ABO/RH(D) O POS   Troponin I (q 6hr x 3)     Status: Abnormal   Collection Time: 02/12/16 10:34 AM  Result Value Ref Range   Troponin I 1.97 (HH) <0.03 ng/mL    Comment: CRITICAL VALUE NOTED.  VALUE IS CONSISTENT WITH PREVIOUSLY REPORTED AND CALLED VALUE.  Glucose, capillary     Status: Abnormal   Collection Time: 02/12/16 11:43 AM  Result Value Ref Range   Glucose-Capillary 114 (H) 65 - 99 mg/dL   Comment 1 Notify RN    Comment 2 Document in Chart   Urinalysis, Routine w reflex microscopic (not at [facility])     Status: Abnormal   Collection Time: 02/12/16  1:19 PM  Result Value Ref Range   Color, Urine YELLOW YELLOW   APPearance HAZY (A) CLEAR   Specific Gravity, Urine 1.014 1.005 -  1.030   pH 5.5 5.0 - 8.0   Glucose, UA NEGATIVE NEGATIVE mg/dL   Hgb urine dipstick LARGE (A) NEGATIVE   Bilirubin Urine NEGATIVE NEGATIVE   Ketones, ur NEGATIVE NEGATIVE mg/dL   Protein, ur 100 (A) NEGATIVE mg/dL   Nitrite NEGATIVE NEGATIVE   Leukocytes, UA NEGATIVE NEGATIVE  Urine microscopic-add on     Status: Abnormal   Collection Time: 02/12/16  1:19 PM  Result Value Ref Range   Squamous Epithelial / LPF 0-5 (A) NONE SEEN   WBC, UA 0-5 0 - 5 WBC/hpf   RBC / HPF 6-30 0 - 5 RBC/hpf   Bacteria, UA FEW (A) NONE SEEN   Casts GRANULAR CAST (A) NEGATIVE   Urine-Other AMORPHOUS URATES/PHOSPHATES     RADIOGRAPHIC STUDIES: I have personally reviewed the radiological images as listed and agreed with the findings in the report. Dg Chest Port 1 View  Result Date: 02/12/2016 CLINICAL DATA:  Status post intubation EXAM: PORTABLE CHEST 1 VIEW COMPARISON:  01/20/2016 FINDINGS: Cardiac shadow is again mildly enlarged. Nasogastric catheter extends to the distal esophagus and is unchanged. Nasogastric catheter is also unchanged stable from the previous exam. The lungs are well aerated bilaterally. No focal infiltrate or sizable effusion is noted. No bony abnormality is seen. IMPRESSION: No significant change from the prior exam. Advancement of the nasogastric  catheter is recommended as clinically indicated. Electronically Signed   By: Inez Catalina M.D.   On: 02/12/2016 08:03   Dg Chest Port 1 View  Result Date: 01/19/2016 CLINICAL DATA:  Initial evaluation for post transport ventilated patient. EXAM: PORTABLE CHEST 1 VIEW COMPARISON:  None available. FINDINGS: Patient is intubated with the tip of an endotracheal tube positioned approximately 3.5 cm above the carina. Enteric tube in place, with tip just above the GE junction. Side hole in distal esophagus. Cardiac and mediastinal silhouettes within normal limits. Lungs normally inflated. No focal infiltrate, pulmonary edema, or pleural effusion. No  pneumothorax. Linear opacity within the right lung base likely reflect atelectasis/scar. No acute osseous abnormality. IMPRESSION: 1. Tip of the endotracheal tube in satisfactory position approximately 3.5 cm above the carina. 2. Tip of enteric tube just above the GE junction, with side hole in the distal esophagus. Advancement recommended. 3. No radiographic evidence for active cardiopulmonary disease. Electronically Signed   By: Jeannine Boga M.D.   On: 02/09/2016 16:47   I review her peripheral blood smear. Leukocytosis with neutrophilia is seen. Showed normochromic normocytic anemia. Platelet count were within normal limits with no evidence of platelet clumping. I do not appreciate a lot of schistocytes. Rare schistocytes are seen ASSESSMENT & PLAN  Mild thrombocytopenia I suspect the acute drop in platelet count seen in the outside facility could be precipitated by hypertensive crisis Even though I see some schistocytes, I do not believe her thrombocytopenia is consistent with ITP. Certainly, she could have some early sepsis especially with associated elevated white blood cell count  Severe anemia Her anemia pattern is not consistent with hemolytic anemia Total bilirubin is within normal limits This is likely due to anemia of chronic illness Recommend transfusion per primary service  Elevated white blood cell count Causes unknown, but could be due to stress reaction from recent resuscitation effort Observe for now  Change in mental status, stroke MRI is pending This is most likely triggered by recent hypertensive crisis It is not clear this is truly related to TTP  Hypertensive crisis Antihypertensives per primary service  Elevated troponin Could be due to myocardial injury from recent resuscitation effort I would defer to primary service/cardiology review  Fungating breast mass It is likely that the patient may have undiagnosed malignancy Undiagnosed malignancy could  probably cause DIC type picture While a simple breast biopsy may help clinch the diagnosis, in the setting of recent stroke and acute renal failure, it is not clear to me that the patient would be a chemotherapy candidate/treatment candidate now I would recommend delaying further work-up until the patient improves clinically  I will return tomorrow afternoon to check on her At this point in time, recommend continue supportive care alone    Heath Lark, MD 02/12/2016 3:28 PM

## 2016-02-12 NOTE — Progress Notes (Signed)
eLink Physician-Brief Progress Note Patient Name: Paulina Annen DOB: 06-Sep-1975 MRN: [id number]   Date of Service  02/12/2016  HPI/Events of Note  Discussed MRI results and care plan with Dr Leonie Man. He is concerned about possible sepsis with DIC (Since heme doesn't think this is TTP). No obvious source of sepsis. Discussed with Dr Ashok Cordia.  eICU Interventions  Wound culture and PCT algorithm ordered     Intervention Category Major Interventions: Other:  Wilhelmina Mcardle 02/12/2016, 6:01 PM

## 2016-02-12 NOTE — Progress Notes (Addendum)
PULMONARY / CRITICAL CARE MEDICINE   Name: Samantha Pittman MRN: [id number] DOB: 1975/06/21    ADMISSION DATE:  02/01/2016 CONSULTATION DATE:  02/07/2016  REFERRING MD:  Dr, Langley Gauss / [facility]   CHIEF COMPLAINT:  Weakness / Dizziness   HISTORY OF PRESENT ILLNESS:   40 y/o F with PMH of tubal ligation, HTN,seizures who presented to [facility] on 10/30 with a 6 day history of weakness, dizziness and difficulty seeing out of her left eye.    Initial evaluation found the patient to be significantly hypertensive with presenting pressure of 224/160 (MAP 81).  Initial labs Na 135, K 3.6, Cl 93, AG 24, BUN 36, sr cr 2.94, glucose 184, mag 2.5, AST 31/ALT 14, alk phos 136, TSH 0.37, WBC 11.3, Hgb 11, and platelets 51.  UA was negative.  She was treated with captopril, ativan, amlodipine and labetalol to reduce blood pressure.  The patient was admitted to [facility] for further evaluation.  She later had a seizure.  Per report, she was felt to be post ictal while on the medical floor.  Am of 10/31, she remained altered and pupils were unequal.  Follow up CT of the head was obtained which demonstrated an abnormal new hypodensity in the head of the right caudate nucleus extending into the right lentiform nucleus, acute or early subacute infarct in this vicinity.  The patient was intubated and transferred to [facility] for further evaluation.    SUBJECTIVE:  No acute events overnight. Patient still not waking up or following commands. Has not required any sedation since admission.  REVIEW OF SYSTEMS:  Unable to obtain with intubation & sedation.  VITAL SIGNS: BP 117/63   Pulse 89   Temp 98.2 F (36.8 C) (Axillary)   Resp (!) 22   Wt 165 lb 2 oz (74.9 kg)   LMP  (LMP Unknown)   SpO2 97%   BMI 26.65 kg/m   HEMODYNAMICS:    VENTILATOR SETTINGS: Vent Mode: PRVC FiO2 (%):  [40 %-60 %] 40 % Set Rate:  [12 bmp] 12 bmp Vt Set:  [400 mL] 400 mL PEEP:  [5 cmH20] 5  cmH20 Plateau Pressure:  [14 cmH20] 14 cmH20  INTAKE / OUTPUT: I/O last 3 completed shifts: In: 79 [I.V.:87] Out: 40 [Urine:40]  PHYSICAL EXAMINATION: General:  No acute distress. Sister and son at bedside.  Integument:  Warm & dry. No rash on exposed skin. No bruising. HEENT:  No scleral injection or icterus. Endotracheal tube in place.  Cardiovascular:  Regular rate. No edema. No appreciable JVD.  Pulmonary:  Clear bilaterally to auscultation. Symmetric chest wall rise on ventilator. Abdomen: Soft. Normal bowel sounds. Nondistended. G Musculoskeletal:  No joint deformity or effusion appreciated. Breast: Exam performed with respiratory therapist at bedside. Palpable mass appreciated in the lower inner quadrant of left breast. No skin changes to left breast or nipple retraction. Neurological: Pupils equal and symmetric. No withdrawal to pain in extremities. No spontaneous movements. Cough reflex is not present.  LABS:  BMET  Recent Labs Lab 01/30/2016 1711 02/12/16 0444  NA 138 138  K 4.1 4.8  CL 104 107  CO2 20* 18*  BUN 56* 64*  CREATININE 6.47* 7.13*  GLUCOSE 136* 115*    Electrolytes  Recent Labs Lab 01/25/2016 1711 02/12/16 0444  CALCIUM 8.3* 8.4*  MG 2.3 2.3  PHOS 6.7* 6.9*    CBC  Recent Labs Lab 01/12/2016 1711 02/12/16 0444  WBC 16.8* 13.5*  HGB 8.2* 7.7*  HCT  24.7* 22.9*  PLT 133*  130* 138*    Coag's  Recent Labs Lab 02/03/2016 1711  APTT 31  INR 1.17  1.22    Sepsis Markers No results for input(s): LATICACIDVEN, PROCALCITON, O2SATVEN in the last 168 hours.  ABG  Recent Labs Lab 01/30/2016 1625  PHART 7.392  PCO2ART 29.6*  PO2ART 169*    Liver Enzymes  Recent Labs Lab 01/17/2016 1711  AST 29  ALT 18  ALKPHOS 100  BILITOT 0.4  ALBUMIN 3.1*    Cardiac Enzymes  Recent Labs Lab 01/19/2016 1711 02/01/2016 2225 02/12/16 0444  TROPONINI 1.22* 1.88* 2.04*    Glucose No results for input(s): GLUCAP in the last 168  hours.  Imaging Dg Chest Port 1 View  Result Date: 02/12/2016 CLINICAL DATA:  Status post intubation EXAM: PORTABLE CHEST 1 VIEW COMPARISON:  01/28/2016 FINDINGS: Cardiac shadow is again mildly enlarged. Nasogastric catheter extends to the distal esophagus and is unchanged. Nasogastric catheter is also unchanged stable from the previous exam. The lungs are well aerated bilaterally. No focal infiltrate or sizable effusion is noted. No bony abnormality is seen. IMPRESSION: No significant change from the prior exam. Advancement of the nasogastric catheter is recommended as clinically indicated. Electronically Signed   By: Inez Catalina M.D.   On: 02/12/2016 08:03   Dg Chest Port 1 View  Result Date: 01/24/2016 CLINICAL DATA:  Initial evaluation for post transport ventilated patient. EXAM: PORTABLE CHEST 1 VIEW COMPARISON:  None available. FINDINGS: Patient is intubated with the tip of an endotracheal tube positioned approximately 3.5 cm above the carina. Enteric tube in place, with tip just above the GE junction. Side hole in distal esophagus. Cardiac and mediastinal silhouettes within normal limits. Lungs normally inflated. No focal infiltrate, pulmonary edema, or pleural effusion. No pneumothorax. Linear opacity within the right lung base likely reflect atelectasis/scar. No acute osseous abnormality. IMPRESSION: 1. Tip of the endotracheal tube in satisfactory position approximately 3.5 cm above the carina. 2. Tip of enteric tube just above the GE junction, with side hole in the distal esophagus. Advancement recommended. 3. No radiographic evidence for active cardiopulmonary disease. Electronically Signed   By: Jeannine Boga M.D.   On: 01/20/2016 16:47    STUDIES:  EEG 10/31:  Mild generalized nonspecific continuous slowing of cerebral activity which can be seen with sedating medications as well as metabolic and toxic encephalopathies. No evidence of epileptiform activity was recorded. MRI/MRA  Head 11/1 >>  [place] Left Breast 11/1 >>   MICROBIOLOGY: MRSA PCR 10/31:  Negative  ANTIBIOTICS: None  SIGNIFICANT EVENTS: 10/30 - Admit to [facility] with weakness, dizziness.  Hypertensive emergency.  Seizure > AMS,  10/31 - AMS continued, CT head worrisome for CVA, transferred to [facility]  LINES/TUBES: OETT 10/31 >>  OGT 10/31 >> FOLEY 10/31 >> PIV x2  ASSESSMENT / PLAN:  NEUROLOGIC A:   Acute Encephalopathy - Unclear etiology. Possible CVA vs PRES vs TTP. 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 Seizure - Occurred w/ correction of hypertensive urgency/emergency. H/O Seizure Disorder  P:   RASS goal: 0  Neurology Consulted & following Seizure Precautions AEDs per Neurology:  Trileptal Versed IV prn Seizure & Sedation Fentanyl IV prn Pain/Discomfort MRI/MRA Head Pending Trileptal Level pending  Checking UDS, TSH, B12, & Free T4.   CARDIOVASCULAR A:  Hypertensive Emergency - Initially. Now normotensive. Hypotension - Resolved. Elevated Troponin I - Likely subendocardial ischemia. H/O HTN - No medications as an outpatient.  P:  Continuous Telemetry  Monitoring Vitals per Unit Protocol Goal SBP >65 D/C Levophed  Continuing to trend Troponin I Checking Complete Echocardiogram  RENAL A:   Acute Renal Failure - No clear baseline. Metabolic Acidosis - Mild. Hyperphosphatemia - Likely due to acute renal failure.  P:   Trending UOP Monitoring renal function & electrolytes daily Consulting Nephrology for possible dialysis versus plasma exchange Checking U/A, Urine Creatinine, & Urine Sodium now Renal U/S Replacing electrolytes as indicated  HEMATOLOGIC/ONCOLOGIC A:   Anemia - No signs of active bleeding. Thrombocytopenia - Mild. Schistocytes on smear. Possible TTP with altered mental status. Left Breast Mass  P:  Trending Cell Counts daily w/ CBC Trending Coags daily SCDs Stat Type & Screen as well as ADAMS TS 13 Consulting Hematology Left Breast  Ultrasound & Probable Biopsy by IR  PULMONARY A: Acute Respiratory Failure - Unable to protect airway with altered mental status.   P:   Full Vent Support Intermittent ABG & Portable CXR Weaning FiO2 for Sat >92% Albuterol neb prn  GASTROINTESTINAL A:   No acute issues.  P:   NPO Pepcid IV q24hr Holding on Tube feedings for now  INFECTIOUS A:   No evidence of acute infection.  P:   Monitoring for signs  ENDOCRINE A:   No acute issues.   P:   Monitor glucose with daily labs.   FAMILY  - Updates: Sister and Son updated at bedside by Dr. Ashok Cordia 11/1.   - Inter-disciplinary family meet or Palliative Care meeting due by:  11/6  TODAY'S SUMMARY:  40 y.o. female with PMH of HTN and seizures admitted to [facility] 10/30 with hypertensive emergency.  BP reduced.  Later had seizure / AMS.  Continued to be altered on 10/31 & CT head worrisome for CVA.  Patient's clinical picture is suspicious for DIC versus thrombotic thrombocytopenic purpura. I am consulted hematology and nephrology for possible need of treatment for TTP. Awaiting evaluation with MRI as well as renal ultrasound and transthoracic echocardiogram. Patient's overall clinical prognosis remains guarded. Appreciate assistance from all consultants.  I have spent a total of 49 minutes of critical care time today caring for the patient, discussing plan of care with Neurology, calling Hematology as well as Nephrology consultations, and reviewing the patient's electronic medical record.   Sonia Baller Ashok Cordia, M.D. [facility] Pulmonary & Critical Care Pager:  [phone] After 3pm or if no response, call [phone] 02/12/2016, 9:50 AM

## 2016-02-12 NOTE — Progress Notes (Signed)
Initial Nutrition Assessment  DOCUMENTATION CODES:   Not applicable  INTERVENTION:   - Once tube repositioned with tip in stomach, recommend initiating TF as outlined below. - Recommend initiating Vital AF 1.2 @ 55 mL/hr (1320 mL) to provide 1584 kcal, 99 grams protein, and 1069 mL free water daily. - Recommend 30 mL Pro-stat once daily to provide 100 kcal and 15 grams protein. - TF regimen provides 1684 kcal (100% estimated energy needs), 114 grams protein (102% estimated protein needs), and 1069 mL free water daily.  NUTRITION DIAGNOSIS:   Inadequate oral intake related to inability to eat as evidenced by NPO status.  GOAL:   Patient will meet greater than or equal to 90% of their needs  MONITOR:   Vent status, Labs, Weight trends  REASON FOR ASSESSMENT:   Ventilator   ASSESSMENT:   40 y/o F with PMH of tubal ligation, HTN,seizures who presented to [facility] on 10/30 with a 6 day history of weakness, dizziness and difficulty seeing out of her left eye. Initial evaluation found the patient to be significantly hypertensive with presenting pressure of 224/160 (MAP 81).  The patient was admitted to [facility] for further evaluation. She later had a seizure. The patient was intubated and transferred to [facility] for further evaluation.   Pt currently intubated on ventilator support. MV: 8.7 L/min Highest temperature in 24 hours: 37.5 degrees Celsius BP (MAP): 122/72 (87)  Per imaging, OG tube extends to distal esophagus. Per note, advancement of OG tube is recommended as clinically indicated.  Noted NP recommendation to consider TF this AM if pt remains intubated. TF recommendations left.  Medications reviewed and include 20 mg Pepcid daily, 4 mg Levophed in D5, PRN Versed, PRN Zofran  Labs reviewed and include elevated BUN (64 mg/dL), elevated creatinine (7.13 mg/dL), low calcium (8.4 mg/dL), elevated phosphorus (6.9 mg/dL), low hemoglobin (7.7 g/dL)  NFPE:  Exam completed. No fat depletion, no muscle depletion, and no edema noted.  Diet Order:  Diet NPO time specified  Skin:  Wound (see comment) (puncture to left breast)  Last BM:  PTA  Height:   Ht Readings from Last 1 Encounters:  10/04/14 5\' 6"  (1.676 m)    Weight:   Wt Readings from Last 1 Encounters:  02/12/16 165 lb 2 oz (74.9 kg)    Ideal Body Weight:  59.1 kg  BMI:  Body mass index is 26.65 kg/m.  Estimated Nutritional Needs:   Kcal:  1690  Protein:  105-112 grams (1.4-1.5 g/day)  Fluid:  2.0 L/day  EDUCATION NEEDS:   No education needs identified at this time  Jeb Levering Dietetic Intern Pager Number: [phone]

## 2016-02-12 NOTE — Progress Notes (Signed)
STROKE TEAM PROGRESS NOTE   HISTORY OF PRESENT ILLNESS (per record) Samantha Pittman is an 40 yo female with hx of seizures and HTN presented to outside hospital with hypertensive urgency. BP was lowered quickly and had a seizure. 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 She was intubated for airway protection and transferred to [facility] for further evaluation.   Patient was not administered IV t-PA. She was admitted to the neuro ICU for further evaluation and treatment.   SUBJECTIVE (INTERVAL HISTORY) Patient remains intubated and obtunded and unresponsive. Blood pressure is better controlled. She has had no further seizures. CT scan done at [facility] shows a hypodensity in the right basal ganglia likely consistent with an acute infarct. There is ill-defined swelling in the cerebellum and the exact etiology is unclear. MRI is ordered but not done. The patient has a fungating left breast mass mimicking CT scan findings also suspicious for possible metastasis   OBJECTIVE Temp:  [98.2 F (36.8 C)-99.5 F (37.5 C)] 98.2 F (36.8 C) (11/01 0800) Pulse Rate:  [78-89] 89 (11/01 0900) Resp:  [16-23] 22 (11/01 0900) BP: (91-125)/(55-71) 117/63 (11/01 0900) SpO2:  [97 %-100 %] 97 % (11/01 0900) FiO2 (%):  [40 %-60 %] 40 % (11/01 0800) Weight:  [74.9 kg (165 lb 2 oz)] 74.9 kg (165 lb 2 oz) (11/01 0228)  CBC:  Recent Labs Lab 01/19/2016 1711 02/12/16 0444  WBC 16.8* 13.5*  NEUTROABS 15.3*  --   HGB 8.2* 7.7*  HCT 24.7* 22.9*  MCV 88.8 90.2  PLT 133*  130* 138*    Basic Metabolic Panel:  Recent Labs Lab 02/03/2016 1711 02/12/16 0444  NA 138 138  K 4.1 4.8  CL 104 107  CO2 20* 18*  GLUCOSE 136* 115*  BUN 56* 64*  CREATININE 6.47* 7.13*  CALCIUM 8.3* 8.4*  MG 2.3 2.3  PHOS 6.7* 6.9*    Lipid Panel: No results found for: CHOL, TRIG, HDL, CHOLHDL, VLDL, LDLCALC HgbA1c: No results  found for: HGBA1C Urine Drug Screen: No results found for: LABOPIA, COCAINSCRNUR, LABBENZ, AMPHETMU, THCU, LABBARB    IMAGING  Dg Chest Port 1 View  Result Date: 02/12/2016 CLINICAL DATA:  Status post intubation EXAM: PORTABLE CHEST 1 VIEW COMPARISON:  02/10/2016 FINDINGS: Cardiac shadow is again mildly enlarged. Nasogastric catheter extends to the distal esophagus and is unchanged. Nasogastric catheter is also unchanged stable from the previous exam. The lungs are well aerated bilaterally. No focal infiltrate or sizable effusion is noted. No bony abnormality is seen. IMPRESSION: No significant change from the prior exam. Advancement of the nasogastric catheter is recommended as clinically indicated. Electronically Signed   By: Inez Catalina M.D.   On: 02/12/2016 08:03   Dg Chest Port 1 View  Result Date: 01/13/2016 CLINICAL DATA:  Initial evaluation for post transport ventilated patient. EXAM: PORTABLE CHEST 1 VIEW COMPARISON:  None available. FINDINGS: Patient is intubated with the tip of an endotracheal tube positioned approximately 3.5 cm above the carina. Enteric tube in place, with tip just above the GE junction. Side hole in distal esophagus. Cardiac and mediastinal silhouettes within normal limits. Lungs normally inflated. No focal infiltrate, pulmonary edema, or pleural effusion. No pneumothorax. Linear opacity within the right lung base likely reflect atelectasis/scar. No acute osseous abnormality. IMPRESSION: 1. Tip of the endotracheal tube in satisfactory position approximately 3.5 cm above the carina. 2. Tip of enteric  tube just above the GE junction, with side hole in the distal esophagus. Advancement recommended. 3. No radiographic evidence for active cardiopulmonary disease. Electronically Signed   By: Jeannine Boga M.D.   On: 02/03/2016 16:47   EEG  This EEG is abnormal with mild generalized nonspecific continuous slowing of cerebral activity which can be seen with sedating  medications as well as metabolic and toxic encephalopathies. No evidence of epileptiform activity was recorded.   PHYSICAL EXAM middle aged african [place] lady who is intubated. Not on sedation. . Afebrile. Head is nontraumatic. Neck is supple without bruit.    Cardiac exam no murmur or gallop. Lungs are clear to auscultation. Distal pulses are well felt. Neurological Exam : Intubated. Comatose. Eyes are closed. Not following any commands. Right pupil is irregular not reactive. Left pupil 2 mm sluggishly reactive. Corneal reflexes are present bilaterally. Fundi could not be visualized. Doll's eye movements are sluggish. Patient has a cough and gag She does not grimace to facial pain. No spontaneous extremity movements. Patient will withdraw both lower extremities greater than upper extremity distant painful stimuli. No posturing. Deep tendon reflexes are 1+ symmetric. Plantars are downgoing. ASSESSMENT/PLAN [name] is a 40 y.o. female with history of HTN and seizures presenting to outlying hospital with hypertensive emergency followed by seizure. She did not present with stroke symptoms. CT shows a stroke, age indeterminate.   Stroke Right subcortical, etiology unclear at this time. Workup underway.  Mental status disproportionate to the size of the stroke and cerebellar edema and fungating left breast mass raise concern for cerebral metastasis Other stroke  CT at outlying hospital with R caudate lacunar infarct  MRI  pending   MRA  pending   Carotid Doppler  pending   2D Echo  pending   LDL ordered  HgbA1c ordered  SCDs for VTE prophylaxis  Diet NPO time specified  No antithrombotic prior to admission, received one dose of aspirin 324 mg yesterday. No further aspirin given likely TTP  Ongoing aggressive stroke risk factor management  Therapy recommendations:  Will add once stable  Disposition:  pending   Seizure   Not related to stroke seen on CT  EEG  generalized nonspecific slowing  Continue trileptal 300 bid  Continuous EEG  Respiratory failure  Intubated after seizure for airway protection  CCM attending  Hypertensive Emergency  Not on home medications  Stable this am Long-term BP goal normotensive  Other Active Problems  Acute kidney injury, Cr 6.47  likely TTP, Thrombocytopenia 130. Considering plasma exchange  Mild anemia 8.2  Ulcerating L breast mass  Hospital day # 1 I have personally examined this patient, reviewed notes, independently viewed imaging studies, participated in medical decision making and plan of care.ROS completed by me personally and pertinent positives fully documented  I have made any additions or clarifications directly to the above note. Plan to check MRI scan of the brain today as well as MRA of the brain and carotid ultrasound to look for etiology of stroke as well as possible brain metastasis given fungating left breast mass which may be malignancy. No family available at the bedside for discussion. Discussed with Dr. Ashok Cordia. This patient is critically ill and at significant risk of neurological worsening, death and care requires constant monitoring of vital signs, hemodynamics,respiratory and cardiac monitoring, extensive review of multiple databases, frequent neurological assessment, discussion with family, other specialists and medical decision making of high complexity.I have made any additions or clarifications directly to the above note.This  critical care time does not reflect procedure time, or teaching time or supervisory time of PA/NP/Med Resident etc but could involve care discussion time.  I spent 30 minutes of neurocritical care time  in the care of  this patient.      Antony Contras, MD Medical Director [facility] Pager: [phone] 02/12/2016 3:22 PM     To contact Stroke Continuity provider, please refer to [email or web address] After hours, contact General  Neurology

## 2016-02-12 NOTE — Procedures (Addendum)
ELECTROENCEPHALOGRAM REPORT  Patient: Camaya Lewi       Room #: N466000 EEG No. ID: [id number] Age: 40 y.o.        Sex: female Referring Physician: Lunette Stands Report Date:  02/12/2016        Interpreting Physician: Anthony Sar  History: Amoreena Stene is an 40 y.o. female with a history of seizure disorder and hypertension who is transferred from hospital following acute treatment of marked hypertension 05 witnessed seizure and prolonged postictal state. Patient was intubated for airway protection. Patient was also noted to have an acute left basal ganglia lacunar infarction.  Indications for study:    Technique: This is an 18 channel routine scalp EEG performed at the bedside with bipolar and monopolar montages arranged in accordance to the international 10/20 system of electrode placement.   Description: Patient was intubated and on mechanical ventilation at the time of this EEG recording. She was receiving fentanyl and Versed for sedation. Predominant cerebral activity consisted of low to moderate amplitude diffuse symmetrical delta activity with superimposed 6-8 Hz largely rhythmic activity diffusely. Photic stimulation was not performed. No epileptiform discharges were recorded. There were no areas of disproportionate slowing of cerebral activity.  Interpretation: This EEG is abnormal with mild generalized nonspecific continuous slowing of cerebral activity which can be seen with sedating medications as well as metabolic and toxic encephalopathies. No evidence of epileptiform activity was recorded.   Rush Farmer M.D. [facility] [phone]

## 2016-02-12 NOTE — Consult Note (Addendum)
Reason for Consult: Acute kidney injury Referring Physician: Tera Pittman M.D. (critical care)  History obtained from chart review HPI:  [name] with past medical history significant for hypertension and seizure disorder who apparently had temporarily stopped her antihypertensive agents prior to presentation to the emergency room at [facility] with weakness, dizziness and difficulty seeing out of her left eye and noted to have significantly elevated blood pressures at 224/160. At the time of admission, also found to have an elevated creatinine of 2.94, hemoglobin of 11 and platelet count of 51,000. She was admitted for management of hypertensive urgency and at some point had a seizure episode with initial CT scan negative. At some point on the day after hospitalization, she became unresponsive with low blood pressures (systolics in the [id number]) and head CT scan showed an abnormal new hypodensity in the right caudate nucleus extending to the right lentiform nucleus-suspicion of early/acute or subacute infarct-mental status required intubation for airway protection and she was started on pressors for blood pressure support prior to transfer to [facility] During the "[place]" she was noticed to have a fungating left breast mass with discharge and odor. Her creatinine had risen to 4.3. Upon evaluation here, creatinine had risen to 6.5 yesterday and is up to 7.1 today. I reviewed her chart from [facility] and did not see any iodinated intravenous contrast exposure or NSAID use. The admission note from there reported NSAID use prior to admission however not quantified.  Her thrombocytopenia of 51,000 has improved to 138,000 when her hemoglobin has declined from 11 on admission to 7.7. Blood smear is notable for schistocytes. LDH is elevated at 631 and haptoglobin is normal at 69. Total bilirubin/transaminases are within normal limits. Repeat urinalysis is pending  and the one from [facility] shows dipstick proteinuria (100) and hematuria (large) with urine RBCs too numerous to count. The patient is apparently on her menses that likely confounded.  Past Medical History:  Diagnosis Date  . High blood pressure   . Seizures     Past Surgical History:  Procedure Laterality Date  . TUBAL LIGATION  1998    Family History  Problem Relation Age of Onset  . Diabetes    . High blood pressure      Social History:  reports that she has never smoked. She has never used smokeless tobacco. She reports that she does not drink alcohol or use drugs.  Allergies:  Allergies  Allergen Reactions  . Dilantin [Phenytoin Sodium Extended]     Medications:  Scheduled: . chlorhexidine  15 mL Mouth Rinse BID  . famotidine (PEPCID) IV  20 mg Intravenous Q24H  . mouth rinse  15 mL Mouth Rinse 10 times per day  . OXcarbazepine  900 mg Per Tube BID    BMP Latest Ref Rng & Units 02/12/2016 02/10/2016 10/04/2014  Glucose 65 - 99 mg/dL 115(H) 136(H) 63(L)  BUN 6 - 20 mg/dL 64(H) 56(H) 8  Creatinine 0.44 - 1.00 mg/dL 7.13(H) 6.47(H) 0.96  BUN/Creat Ratio 9 - 23 - - 8(L)  Sodium 135 - 145 mmol/L 138 138 140  Potassium 3.5 - 5.1 mmol/L 4.8 4.1 4.8  Chloride 101 - 111 mmol/L 107 104 101  CO2 22 - 32 mmol/L 18(L) 20(L) 25  Calcium 8.9 - 10.3 mg/dL 8.4(L) 8.3(L) 9.8   CBC Latest Ref Rng & Units 02/12/2016 02/03/2016 01/27/2016  WBC 4.0 - 10.5 K/uL 13.5(H) - 16.8(H)  Hemoglobin 12.0 - 15.0 g/dL 7.7(L) - 8.2(L)  Hematocrit 36.0 - 46.0 % 22.9(L) - 24.7(L)  Platelets 150 - 400 K/uL 138(L) 133(L) 130(L)     Dg Chest Port 1 View  Result Date: 02/12/2016 CLINICAL DATA:  Status post intubation EXAM: PORTABLE CHEST 1 VIEW COMPARISON:  02/07/2016 FINDINGS: Cardiac shadow is again mildly enlarged. Nasogastric catheter extends to the distal esophagus and is unchanged. Nasogastric catheter is also unchanged stable from the previous exam. The lungs are well aerated bilaterally. No  focal infiltrate or sizable effusion is noted. No bony abnormality is seen. IMPRESSION: No significant change from the prior exam. Advancement of the nasogastric catheter is recommended as clinically indicated. Electronically Signed   By: Inez Catalina M.D.   On: 02/12/2016 08:03   Dg Chest Port 1 View  Result Date: 02/03/2016 CLINICAL DATA:  Initial evaluation for post transport ventilated patient. EXAM: PORTABLE CHEST 1 VIEW COMPARISON:  None available. FINDINGS: Patient is intubated with the tip of an endotracheal tube positioned approximately 3.5 cm above the carina. Enteric tube in place, with tip just above the GE junction. Side hole in distal esophagus. Cardiac and mediastinal silhouettes within normal limits. Lungs normally inflated. No focal infiltrate, pulmonary edema, or pleural effusion. No pneumothorax. Linear opacity within the right lung base likely reflect atelectasis/scar. No acute osseous abnormality. IMPRESSION: 1. Tip of the endotracheal tube in satisfactory position approximately 3.5 cm above the carina. 2. Tip of enteric tube just above the GE junction, with side hole in the distal esophagus. Advancement recommended. 3. No radiographic evidence for active cardiopulmonary disease. Electronically Signed   By: Jeannine Boga M.D.   On: 01/28/2016 16:47    Review of Systems  Unable to perform ROS: Intubated   Blood pressure 121/68, pulse 95, temperature 99.4 F (37.4 C), temperature source Oral, resp. rate (!) 21, weight 74.9 kg (165 lb 2 oz), SpO2 100 %. Physical Exam  Nursing note and vitals reviewed. Constitutional: She appears well-developed and well-nourished.  Sedated  HENT:  Head: Normocephalic and atraumatic.  Intubated, sedated  Neck: No JVD present.  Cardiovascular: Normal rate, regular rhythm and normal heart sounds.   No murmur heard. Respiratory: Effort normal and breath sounds normal. She has no wheezes. She has no rales.  Dressing over mass in left lower  quadrant of left breast  GI: Bowel sounds are normal. There is no tenderness. There is no rebound.  Musculoskeletal: She exhibits no edema.  Lymphadenopathy:    She has no cervical adenopathy.  Skin: Skin is warm and dry. No rash noted.    Assessment/Plan: 1. Acute renal failure: Currently appears oliguric versus anuric. Highly likely that this is hemodynamically mediated given large fluctuations in blood pressure noted from records available from [facility]. The injury is likely to be consistent with ischemic ATN given presence of granular casts seen in her urinalysis from [facility]. Suspicion is raised regarding TTP/HUS but based on the data-I suspect that she indeed might have some hypertensive associated thrombotic microangiopathy accounting for the drop of her hemoglobin/thrombocytopenia and schistocytes seen on blood smear. She does not appear to have any severe hemolysis going on at this time. I will await repeat urinalysis/urine electrolytes and renal ultrasound in order to further evaluate the etiology of her injury. Agree with continued avoidance of nephrotoxins including NSAIDs while renal dosing medications/antibiotics. Avoid iodinated intravenous contrast. No acute indications noted for dialysis at this point however with poor urine output, likely to need dialysis in the near future. If blood pressures rise, may try Lasix challenge (80  mg intravenously). 2. Malignant hypertension: Blood pressure currently under acceptable control-off pressors and not on any antihypertensive agent 3. Anemia/thrombocytopenia: Overall declining hemoglobin count noted with slowly rising platelet count now up to 138,000. No overwhelming evidence of hemolysis except for elevated LDH. 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 4. Left breast mass: Ongoing evaluation for possible malignancy-high index of suspicion for this. 5. Seizure disorder/altered mentation: Suspicion  raised regarding possible acute ischemic CVA versus PRES-ongoing evaluation by neurology and critical care for possible brain metastasis from what appears to be most likely consistent with breast cancer mass.  Samantha Pittman K. 02/12/2016, 1:10 PM

## 2016-02-12 NOTE — Consult Note (Addendum)
[facility] Nurse wound consult note Reason for Consult: Consult requested for left breast. Pt has a significant palpable mass to the left breast tissue and a full thickness wound to the inner left breast skin fold; .5X.5X.1cm, yellow and moist, mod amt tan drainage with some odor. This is probably the result of the mass creating pressure against the skin from inside. Dressing procedure/placement/frequency: Topical treatment will not be effective; recommend radiology studies for mass in left breast. Xeroform gauze to affected area to protect from further injury until results are available. No family members available to discuss plan of care. Please re-consult if further assistance is needed.  Thank-you,  Julien Girt MSN, [place], [place], [place], [place]

## 2016-02-12 NOTE — Progress Notes (Signed)
Dr. Cristobal Goldmann made aware that MRI results ready.

## 2016-02-12 DEATH — deceased

## 2016-02-13 ENCOUNTER — Inpatient Hospital Stay (HOSPITAL_COMMUNITY): Payer: Medicaid Other

## 2016-02-13 DIAGNOSIS — R7989 Other specified abnormal findings of blood chemistry: Secondary | ICD-10-CM

## 2016-02-13 DIAGNOSIS — I6783 Posterior reversible encephalopathy syndrome: Secondary | ICD-10-CM

## 2016-02-13 DIAGNOSIS — I63039 Cerebral infarction due to thrombosis of unspecified carotid artery: Secondary | ICD-10-CM

## 2016-02-13 DIAGNOSIS — I639 Cerebral infarction, unspecified: Secondary | ICD-10-CM

## 2016-02-13 LAB — COMPREHENSIVE METABOLIC PANEL
ALK PHOS: 115 U/L (ref 38–126)
ALT: 271 U/L — AB (ref 14–54)
AST: 210 U/L — ABNORMAL HIGH (ref 15–41)
Albumin: 2.4 g/dL — ABNORMAL LOW (ref 3.5–5.0)
Anion gap: 15 (ref 5–15)
BUN: 103 mg/dL — ABNORMAL HIGH (ref 6–20)
CALCIUM: 8.2 mg/dL — AB (ref 8.9–10.3)
CO2: 15 mmol/L — ABNORMAL LOW (ref 22–32)
CREATININE: 9.54 mg/dL — AB (ref 0.44–1.00)
Chloride: 110 mmol/L (ref 101–111)
GFR, EST AFRICAN AMERICAN: 5 mL/min — AB (ref >60)
GFR, EST NON AFRICAN AMERICAN: 4 mL/min — AB (ref >60)
Glucose, Bld: 112 mg/dL — ABNORMAL HIGH (ref 65–99)
Potassium: 5.3 mmol/L — ABNORMAL HIGH (ref 3.5–5.1)
Sodium: 140 mmol/L (ref 135–145)
Total Bilirubin: 0.6 mg/dL (ref 0.3–1.2)
Total Protein: 5.5 g/dL — ABNORMAL LOW (ref 6.5–8.1)

## 2016-02-13 LAB — RENAL FUNCTION PANEL
ALBUMIN: 2.5 g/dL — AB (ref 3.5–5.0)
ANION GAP: 15 (ref 5–15)
BUN: 85 mg/dL — ABNORMAL HIGH (ref 6–20)
CALCIUM: 8.5 mg/dL — AB (ref 8.9–10.3)
CO2: 18 mmol/L — ABNORMAL LOW (ref 22–32)
Chloride: 105 mmol/L (ref 101–111)
Creatinine, Ser: 8.6 mg/dL — ABNORMAL HIGH (ref 0.44–1.00)
GFR calc non Af Amer: 5 mL/min — ABNORMAL LOW (ref >60)
GFR, EST AFRICAN AMERICAN: 6 mL/min — AB (ref >60)
GLUCOSE: 131 mg/dL — AB (ref 65–99)
PHOSPHORUS: 8.6 mg/dL — AB (ref 2.5–4.6)
POTASSIUM: 5.2 mmol/L — AB (ref 3.5–5.1)
Sodium: 138 mmol/L (ref 135–145)

## 2016-02-13 LAB — BLOOD CULTURE ID PANEL (REFLEXED)
Acinetobacter baumannii: NOT DETECTED
CANDIDA TROPICALIS: NOT DETECTED
Candida albicans: NOT DETECTED
Candida glabrata: NOT DETECTED
Candida krusei: NOT DETECTED
Candida parapsilosis: NOT DETECTED
ENTEROBACTERIACEAE SPECIES: NOT DETECTED
Enterobacter cloacae complex: NOT DETECTED
Enterococcus species: NOT DETECTED
Escherichia coli: NOT DETECTED
HAEMOPHILUS INFLUENZAE: NOT DETECTED
KLEBSIELLA PNEUMONIAE: NOT DETECTED
Klebsiella oxytoca: NOT DETECTED
Listeria monocytogenes: NOT DETECTED
NEISSERIA MENINGITIDIS: NOT DETECTED
PROTEUS SPECIES: NOT DETECTED
Pseudomonas aeruginosa: NOT DETECTED
STAPHYLOCOCCUS AUREUS BCID: NOT DETECTED
STAPHYLOCOCCUS SPECIES: NOT DETECTED
STREPTOCOCCUS AGALACTIAE: NOT DETECTED
STREPTOCOCCUS SPECIES: DETECTED — AB
Serratia marcescens: NOT DETECTED
Streptococcus pneumoniae: NOT DETECTED
Streptococcus pyogenes: NOT DETECTED

## 2016-02-13 LAB — VAS US CAROTID
LCCADDIAS: -16 cm/s
LCCADSYS: -107 cm/s
LCCAPDIAS: 19 cm/s
LCCAPSYS: 127 cm/s
LEFT ECA DIAS: -19 cm/s
LEFT VERTEBRAL DIAS: 16 cm/s
LICADSYS: -105 cm/s
LICAPDIAS: -27 cm/s
Left ICA dist dias: -40 cm/s
Left ICA prox sys: -91 cm/s

## 2016-02-13 LAB — CBC WITH DIFFERENTIAL/PLATELET
BASOS ABS: 0 10*3/uL (ref 0.0–0.1)
Basophils Relative: 0 %
EOS ABS: 0 10*3/uL (ref 0.0–0.7)
Eosinophils Relative: 0 %
HEMATOCRIT: 22.4 % — AB (ref 36.0–46.0)
HEMOGLOBIN: 7.6 g/dL — AB (ref 12.0–15.0)
LYMPHS PCT: 5 %
Lymphs Abs: 0.6 10*3/uL — ABNORMAL LOW (ref 0.7–4.0)
MCH: 30 pg (ref 26.0–34.0)
MCHC: 33.9 g/dL (ref 30.0–36.0)
MCV: 88.5 fL (ref 78.0–100.0)
MONOS PCT: 5 %
Monocytes Absolute: 0.6 10*3/uL (ref 0.1–1.0)
NEUTROS ABS: 10 10*3/uL — AB (ref 1.7–7.7)
NEUTROS PCT: 90 %
Platelets: 198 10*3/uL (ref 150–400)
RBC: 2.53 MIL/uL — AB (ref 3.87–5.11)
RDW: 15.9 % — AB (ref 11.5–15.5)
WBC: 11.2 10*3/uL — ABNORMAL HIGH (ref 4.0–10.5)

## 2016-02-13 LAB — APTT: APTT: 31 s (ref 24–36)

## 2016-02-13 LAB — LIPID PANEL
CHOL/HDL RATIO: 2.8 ratio
CHOLESTEROL: 183 mg/dL (ref 0–200)
HDL: 66 mg/dL (ref >40)
LDL CALC: 100 mg/dL — AB (ref 0–99)
TRIGLYCERIDES: 83 mg/dL (ref <150)
VLDL: 17 mg/dL (ref 0–40)

## 2016-02-13 LAB — LACTIC ACID, PLASMA: LACTIC ACID, VENOUS: 1 mmol/L (ref 0.5–1.9)

## 2016-02-13 LAB — POCT I-STAT 3, ART BLOOD GAS (G3+)
Acid-base deficit: 9 mmol/L — ABNORMAL HIGH (ref 0.0–2.0)
Bicarbonate: 16.7 mmol/L — ABNORMAL LOW (ref 20.0–28.0)
O2 Saturation: 96 %
Patient temperature: 97.7
TCO2: 18 mmol/L (ref 0–100)
pCO2 arterial: 32.1 mmHg (ref 32.0–48.0)
pH, Arterial: 7.323 — ABNORMAL LOW (ref 7.350–7.450)
pO2, Arterial: 83 mmHg (ref 83.0–108.0)

## 2016-02-13 LAB — ECHOCARDIOGRAM COMPLETE: Weight: 2578.5 oz

## 2016-02-13 LAB — MAGNESIUM: Magnesium: 2.3 mg/dL (ref 1.7–2.4)

## 2016-02-13 LAB — 10-HYDROXYCARBAZEPINE: TRILIPTAL/MTB(OXCARBAZEPIN): 32 ug/mL (ref 10–35)

## 2016-02-13 LAB — PROTIME-INR
INR: 1.38
Prothrombin Time: 17.1 seconds — ABNORMAL HIGH (ref 11.4–15.2)

## 2016-02-13 LAB — PROCALCITONIN: Procalcitonin: 8.77 ng/mL

## 2016-02-13 LAB — FIBRINOGEN: Fibrinogen: 632 mg/dL — ABNORMAL HIGH (ref 210–475)

## 2016-02-13 MED ORDER — SODIUM CHLORIDE 0.9 % IV BOLUS (SEPSIS)
250.0000 mL | Freq: Once | INTRAVENOUS | Status: AC
  Administered 2016-02-13: 250 mL via INTRAVENOUS

## 2016-02-13 MED ORDER — NALOXONE HCL 0.4 MG/ML IJ SOLN: 0.4000 mg | INTRAMUSCULAR | Status: DC | PRN

## 2016-02-13 MED ORDER — FUROSEMIDE 10 MG/ML IJ SOLN: 20.0000 mg | Freq: Once | INTRAMUSCULAR | Status: DC

## 2016-02-13 MED ORDER — DIPHENHYDRAMINE HCL 12.5 MG/5ML PO ELIX
12.5000 mg | ORAL_SOLUTION | Freq: Four times a day (QID) | ORAL | Status: DC | PRN
  Filled 2016-02-13: qty 5

## 2016-02-13 MED ORDER — FUROSEMIDE 10 MG/ML IJ SOLN
120.0000 mg | Freq: Once | INTRAVENOUS | Status: AC
  Administered 2016-02-13: 120 mg via INTRAVENOUS
  Filled 2016-02-13: qty 12

## 2016-02-13 MED ORDER — PIPERACILLIN-TAZOBACTAM IN DEX 2-0.25 GM/50ML IV SOLN
2.2500 g | Freq: Four times a day (QID) | INTRAVENOUS | Status: DC
  Administered 2016-02-13 – 2016-02-15 (×7): 2.25 g via INTRAVENOUS
  Filled 2016-02-13 (×10): qty 50

## 2016-02-13 MED ORDER — HYDRALAZINE HCL 20 MG/ML IJ SOLN
10.0000 mg | INTRAMUSCULAR | Status: DC | PRN
  Administered 2016-02-13 – 2016-02-17 (×7): 10 mg via INTRAVENOUS
  Filled 2016-02-13 (×7): qty 1

## 2016-02-13 MED ORDER — LIDOCAINE-EPINEPHRINE 1 %-1:100000 IJ SOLN
INTRAMUSCULAR | Status: AC
  Administered 2016-02-13: 12:00:00
  Filled 2016-02-13: qty 1

## 2016-02-13 MED ORDER — SODIUM CHLORIDE 0.9 % IV BOLUS (SEPSIS)
1000.0000 mL | Freq: Once | INTRAVENOUS | Status: AC
  Administered 2016-02-13: 1000 mL via INTRAVENOUS

## 2016-02-13 MED ORDER — VANCOMYCIN HCL IN DEXTROSE 1-5 GM/200ML-% IV SOLN
1000.0000 mg | Freq: Once | INTRAVENOUS | Status: AC
  Administered 2016-02-13: 1000 mg via INTRAVENOUS
  Filled 2016-02-13: qty 200

## 2016-02-13 MED ORDER — ONDANSETRON HCL 4 MG/2ML IJ SOLN: 4.0000 mg | Freq: Four times a day (QID) | INTRAMUSCULAR | Status: DC | PRN

## 2016-02-13 MED ORDER — PIPERACILLIN-TAZOBACTAM 3.375 G IVPB 30 MIN
3.3750 g | Freq: Once | INTRAVENOUS | Status: DC
  Filled 2016-02-13: qty 50

## 2016-02-13 MED ORDER — DIPHENHYDRAMINE HCL 50 MG/ML IJ SOLN: 12.5000 mg | Freq: Four times a day (QID) | INTRAMUSCULAR | Status: DC | PRN

## 2016-02-13 MED ORDER — FUROSEMIDE 10 MG/ML IJ SOLN
80.0000 mg | Freq: Once | INTRAMUSCULAR | Status: AC
  Administered 2016-02-13: 80 mg via INTRAVENOUS
  Filled 2016-02-13: qty 8

## 2016-02-13 MED ORDER — FENTANYL 40 MCG/ML IV SOLN: INTRAVENOUS | Status: DC

## 2016-02-13 MED ORDER — SODIUM CHLORIDE 0.9% FLUSH: 9.0000 mL | INTRAVENOUS | Status: DC | PRN

## 2016-02-13 NOTE — Consult Note (Signed)
Chief Complaint: Patient was seen in consultation today for biopsy of L breast mass at the request of M. Ramaswamy  Referring Physician(s): Dr. Ann Lions  Supervising Physician: Sandi Mariscal  Patient Status: [facility] - In-pt  History of Present Illness: Samantha Pittman is a 40 y.o. female  Admitted for hypertensive urgency, seizure, AMS workup on 02/01/2016.   Since admission [facility], ARDS on vent and identification of a mass on left breast. No breast imaging available Palpable fungating left breast mass Request made per Dr. Chase Caller for biopsy Imaging reviewed with Dr. Owens Shark Approves procedure Patient has been consulted by Dr. Heath Lark.   Past Medical History:  Diagnosis Date  . High blood pressure   . Seizures     Past Surgical History:  Procedure Laterality Date  . TUBAL LIGATION  1998    Allergies: Dilantin [phenytoin sodium extended]  Medications: Prior to Admission medications   Medication Sig Start Date End Date Taking? Authorizing Provider  ibuprofen (ADVIL,MOTRIN) 200 MG tablet Take 600-800 mg by mouth every 8 (eight) hours as needed for cramping.   Yes Historical Provider, MD  Oxcarbazepine (TRILEPTAL) 300 MG tablet TAKE 3 TABLETS BY MOUTH 2 TIMES DAILY. 11/14/15  Yes Marcial Pacas, MD     Family History  Problem Relation Age of Onset  . Diabetes    . High blood pressure      Social History   Social History  . Marital status: Single    Spouse name: N/A  . Number of children: 2  . Years of education: 12+   Occupational History  . unemployed     Social History Main Topics  . Smoking status: Never Smoker  . Smokeless tobacco: Never Used  . Alcohol use No  . Drug use: No  . Sexual activity: Not on file   Other Topics Concern  . Not on file   Social History Narrative   Patient lives at home with family.    Patient has a college education.   Patient has 2 children.    Started new job, standing on cement.  10-04-14    Review of Systems: A  12 point ROS discussed and pertinent positives are indicated in the HPI above.  All other systems are negative.  Review of Systems  Respiratory:       Vent    Vital Signs: BP 102/69   Pulse (!) 102   Temp 99.6 F (37.6 C) (Oral)   Resp 17   Wt 161 lb 2.5 oz (73.1 kg)   LMP  (LMP Unknown)   SpO2 100%   BMI 26.01 kg/m   Physical Exam  Cardiovascular: Normal rate, regular rhythm, normal heart sounds and intact distal pulses.   Pulmonary/Chest:  On Vent   Abdominal: Soft. Bowel sounds are normal.  Skin: Skin is warm and dry.  Palpable left breast mass.  Lower medial lesion Dimpling of skin noted Malodorous discharge from inferior breast lesion    Mallampati Score:  MD Evaluation Airway: Other (comments) Airway comments: vent Heart: WNL Abdomen: WNL Chest/ Lungs: Other (comments) Chest/ lungs comments: vent ASA  Classification: 3 Mallampati/Airway Score: Three  Imaging: Mr Virgel Paling [id number] Contrast  Result Date: 02/12/2016 CLINICAL DATA:  40 year old female with personal history of hypertension, originally presenting with dizziness and then became unresponsive and had seizure activity. Abnormal head CT. Transferred to the ICU. Initial encounter. EXAM: MRI HEAD WITHOUT CONTRAST MRA HEAD WITHOUT CONTRAST TECHNIQUE: Multiplanar, multiecho pulse sequences of the brain and surrounding  structures were obtained without intravenous contrast. Angiographic images of the head were obtained using MRA technique without contrast. COMPARISON:  [facility] noncontrast head CTs 02/08/2016 and earlier FINDINGS: MRI HEAD FINDINGS Brain: There are scattered mostly small foci of restricted diffusion in the bilateral deep gray matter nuclei (including both the right basal ganglia and right thalamus), in both occipital poles, occasionally in both frontal lobes and parietal lobes, and more confluently affecting both cerebellar hemispheres, worse on the left. Left cerebellar restricted  diffusion encompasses 4-5 cm. There is associated T2 and FLAIR hyperintensity in those areas, but there is profound abnormal T2 and FLAIR hyperintensity throughout the bilateral pons and medulla which corresponds to facilitated diffusion on ADC. Furthermore, the cerebral hemispheres do not demonstrate additional gray and white matter T2 and FLAIR hyperintensity outside of the restricted areas. Also, there is no evidence of associated acute intracranial hemorrhage. Subsequently there is some effacement of the pre pontine and pre medullary cisterns. There is mild mass effect on the fourth ventricle but no ventriculomegaly. No midline shift. No impending herniation. No underlying cortical encephalomalacia or chronic cerebral blood products. Negative pituitary. No extra-axial collection. Mesial temporal lobe structures appear normal. Vascular: Major intracranial vascular flow voids are preserved. Skull and upper cervical spine: Grossly negative. Sinuses/Orbits: Negative. Other: Visible internal auditory structures appear normal. Mastoids are clear. Negative scalp soft tissues. MRA HEAD FINDINGS Antegrade flow in both distal vertebral arteries. No distal vertebral stenosis. Both PICA origins are patent. Patent vertebrobasilar junction. No basilar stenosis. SCA and PCA origins are patent. Posterior communicating arteries are diminutive or absent. Bilateral PCA branches are perhaps mildly irregular but otherwise normal. Antegrade flow in both ICA siphons. No siphon stenosis. Both carotid termini are patent. There is mild right ACA A1 segment irregularity and stenosis. Anterior communicating artery and visualized bilateral ACA branches are within normal limits. Both MCA M1 segments are patent. Both MCA bifurcations are patent. Visualized bilateral MCA branches are within normal limits. IMPRESSION: 1. Extensive edema affecting the bilateral pons and medulla with associated brainstem swelling. No associated hemorrhage. 2.  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 No associated hemorrhage or mass effect. 3. Intracranial MRA is negative for emergent large vessel occlusion. There is at most mild irregularity of the bilateral PCAs and the right ACA. 4. No superimposed non infarct related hemispheric gray or white matter signal abnormality. 5. This constellation is unusual. Perhaps this is usual appearance of Severe PRES (posterior reversible encephalopathy syndrome) given the clinical presentation with hypertension and seizure. Otherwise consider an osmotic demyelination or severe metabolic encephalopathy with superimposed acute embolic infarcts. Electronically Signed   By: Genevie Ann M.D.   On: 02/12/2016 16:47   Mr Brain Wo Contrast  Result Date: 02/12/2016 CLINICAL DATA:  40 year old female with personal history of hypertension, originally presenting with dizziness and then became unresponsive and had seizure activity. Abnormal head CT. Transferred to the ICU. Initial encounter. EXAM: MRI HEAD WITHOUT CONTRAST MRA HEAD WITHOUT CONTRAST TECHNIQUE: Multiplanar, multiecho pulse sequences of the brain and surrounding structures were obtained without intravenous contrast. Angiographic images of the head were obtained using MRA technique without contrast. COMPARISON:  [facility] noncontrast head CTs 01/28/2016 and earlier FINDINGS: MRI HEAD FINDINGS Brain: There are scattered mostly small foci of restricted diffusion in the bilateral deep gray matter nuclei (including both the right basal ganglia and right thalamus), in both occipital poles, occasionally  in both frontal lobes and parietal lobes, and more confluently affecting both cerebellar hemispheres, worse on the left. Left cerebellar restricted diffusion encompasses 4-5 cm. There is associated T2 and FLAIR hyperintensity in those areas,  but there is profound abnormal T2 and FLAIR hyperintensity throughout the bilateral pons and medulla which corresponds to facilitated diffusion on ADC. Furthermore, the cerebral hemispheres do not demonstrate additional gray and white matter T2 and FLAIR hyperintensity outside of the restricted areas. Also, there is no evidence of associated acute intracranial hemorrhage. Subsequently there is some effacement of the pre pontine and pre medullary cisterns. There is mild mass effect on the fourth ventricle but no ventriculomegaly. No midline shift. No impending herniation. No underlying cortical encephalomalacia or chronic cerebral blood products. Negative pituitary. No extra-axial collection. Mesial temporal lobe structures appear normal. Vascular: Major intracranial vascular flow voids are preserved. Skull and upper cervical spine: Grossly negative. Sinuses/Orbits: Negative. Other: Visible internal auditory structures appear normal. Mastoids are clear. Negative scalp soft tissues. MRA HEAD FINDINGS Antegrade flow in both distal vertebral arteries. No distal vertebral stenosis. Both PICA origins are patent. Patent vertebrobasilar junction. No basilar stenosis. SCA and PCA origins are patent. Posterior communicating arteries are diminutive or absent. Bilateral PCA branches are perhaps mildly irregular but otherwise normal. Antegrade flow in both ICA siphons. No siphon stenosis. Both carotid termini are patent. There is mild right ACA A1 segment irregularity and stenosis. Anterior communicating artery and visualized bilateral ACA branches are within normal limits. Both MCA M1 segments are patent. Both MCA bifurcations are patent. Visualized bilateral MCA branches are within normal limits. IMPRESSION: 1. Extensive edema affecting the bilateral pons and medulla with associated brainstem swelling. No associated hemorrhage. 2. 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 No associated hemorrhage or mass effect. 3. Intracranial MRA is negative for emergent large vessel occlusion. There is at most mild irregularity of the bilateral PCAs and the right ACA. 4. No superimposed non infarct related hemispheric gray or white matter signal abnormality. 5. This constellation is unusual. Perhaps this is usual appearance of Severe PRES (posterior reversible encephalopathy syndrome) given the clinical presentation with hypertension and seizure. Otherwise consider an osmotic demyelination or severe metabolic encephalopathy with superimposed acute embolic infarcts. Electronically Signed   By: Genevie Ann M.D.   On: 02/12/2016 16:47   US Renal Port  Result Date: 02/12/2016 CLINICAL DATA:  Thrombocytopenia.  Hypertension. EXAM: RENAL / URINARY TRACT ULTRASOUND COMPLETE COMPARISON:  None. FINDINGS: Right Kidney: Length: 10.1 cm. Echogenicity within normal limits. No mass or hydronephrosis visualized. Left Kidney: Length: 10.1 cm. Echogenicity within normal limits. No mass or hydronephrosis visualized. Bladder: Decompressed around a Foley catheter IMPRESSION: No significant abnormality. Electronically Signed   By: Andreas Newport M.D.   On: 02/12/2016 22:25   Dg Chest Port 1 View  Result Date: 02/12/2016 CLINICAL DATA:  Status post intubation EXAM: PORTABLE CHEST 1 VIEW COMPARISON:  01/31/2016 FINDINGS: Cardiac shadow is again mildly enlarged. Nasogastric catheter extends to the distal esophagus and is unchanged. Nasogastric catheter is also unchanged stable from the previous exam. The lungs are well aerated bilaterally. No focal infiltrate or sizable effusion is noted. No bony abnormality is seen. IMPRESSION: No significant change from the prior exam. Advancement of the nasogastric catheter is recommended as clinically indicated. Electronically Signed   By: Inez Catalina M.D.   On: 02/12/2016 08:03   Dg Chest Port 1  View  Result Date: 01/13/2016 CLINICAL DATA:  Initial evaluation for post transport ventilated patient. EXAM: PORTABLE CHEST 1 VIEW COMPARISON:  None available. FINDINGS: Patient is intubated with the tip of an endotracheal tube positioned approximately 3.5 cm above the carina. Enteric tube in place, with tip just above the GE junction. Side hole in distal esophagus. Cardiac and mediastinal silhouettes within normal limits. Lungs normally inflated. No focal infiltrate, pulmonary edema, or pleural effusion. No pneumothorax. Linear opacity within the right lung base likely reflect atelectasis/scar. No acute osseous abnormality. IMPRESSION: 1. Tip of the endotracheal tube in satisfactory position approximately 3.5 cm above the carina. 2. Tip of enteric tube just above the GE junction, with side hole in the distal esophagus. Advancement recommended. 3. No radiographic evidence for active cardiopulmonary disease. Electronically Signed   By: Jeannine Boga M.D.   On: 01/16/2016 16:47    Labs:  CBC:  Recent Labs  01/31/2016 1711 02/12/16 0444 02/13/16 0215  WBC 16.8* 13.5* 11.2*  HGB 8.2* 7.7* 7.6*  HCT 24.7* 22.9* 22.4*  PLT 133*  130* 138* 198    COAGS:  Recent Labs  01/18/2016 1711 02/13/16 0215  INR 1.17  1.22 1.38  APTT 31 31    BMP:  Recent Labs  01/28/2016 1711 02/12/16 0444 02/13/16 0215  NA 138 138 138  K 4.1 4.8 5.2*  CL 104 107 105  CO2 20* 18* 18*  GLUCOSE 136* 115* 131*  BUN 56* 64* 85*  CALCIUM 8.3* 8.4* 8.5*  CREATININE 6.47* 7.13* 8.60*  GFRNONAA 7* 6* 5*  GFRAA 8* 7* 6*    LIVER FUNCTION TESTS:  Recent Labs  01/12/2016 1711 02/13/16 0215  BILITOT 0.4  --   AST 29  --   ALT 18  --   ALKPHOS 100  --   PROT 6.3*  --   ALBUMIN 3.1* 2.5*    TUMOR MARKERS: No results for input(s): AFPTM, CEA, CA199, CHROMGRNA in the last 8760 hours.  Assessment and Plan:  Left breast mass Seizures AMS and neuro changes Planned for breast mass biopsy in [place]  today Risks and Benefits discussed with the patient including, but not limited to bleeding, infection, damage to adjacent structures or low yield requiring additional tests. All of the patient's questions were answered, patient is agreeable to proceed. Consent signed and in chart.   Thank you for this interesting consult.  I greatly enjoyed meeting Kasumi Foulkes and look forward to participating in their care.  A copy of this report was sent to the requesting provider on this date.  Electronically Signed: Shamond Skelton A 02/13/2016, 11:45 AM   I spent a total of 40 minutes     in face to face in clinical consultation, greater than 50% of which was counseling/coordinating care for left breast mass bx

## 2016-02-13 NOTE — Progress Notes (Signed)
Called [facility]ink, Dr Alva Garnet informed pt has potassium level 5.2, no orders given to this rn

## 2016-02-13 NOTE — Progress Notes (Signed)
Pharmacy Antibiotic Note  Samantha Pittman is a 40 y.o. female admitted on 01/26/2016 with hypertensive crisis and possible sepsis.  Pharmacy has been consulted for vancomycin and zosyn dosing. Patient with acute renal failure, SCr 8.6 and CrCl ~10 mL/min. Holding on CRRT/HD right now while IVF administered. Will follow-up renal plan and dose vancomycin based on levels. WBC slightly elevated at 11.2, but downtrending.   Plan: Vancomycin 1g IV x1, goal trough 15-20 mcg/mL Dose vancomycin based on levels  Zosyn 2.25g IV q6hr  Monitor renal function, culture results, clinical picture, and vancomycin levels as needed   Weight: 161 lb 2.5 oz (73.1 kg)  Temp (24hrs), Avg:100 F (37.8 C), Min:98.6 F (37 C), Max:103.1 F (39.5 C)   Recent Labs Lab 01/17/2016 1711 02/12/16 0444 02/13/16 0215  WBC 16.8* 13.5* 11.2*  CREATININE 6.47* 7.13* 8.60*    CrCl cannot be calculated (Unknown ideal weight.).    Allergies  Allergen Reactions  . Dilantin [Phenytoin Sodium Extended]     Antimicrobials this admission: 11/2 vanc >>  11/2 zosyn >>   Dose adjustments this admission: n/a   Microbiology results: 11/2 BCx: pending (1/2 with GPCs in pairs/chains) 11/2 wound cx (breast): pending  10/31 MRSA PCR: neg   Argie Ramming, PharmD Pharmacy Resident  Pager [phone] 02/13/16 10:50 AM

## 2016-02-13 NOTE — Procedures (Signed)
Technically successful US guided biopsy of indeterminate mass within the medial aspect of the left breast.   EBL: Minimal   No immediate complications.   Ronny Bacon, MD Pager #: [phone]

## 2016-02-13 NOTE — Progress Notes (Signed)
PULMONARY / CRITICAL CARE MEDICINE   Name: Ashleynicole Mcclees MRN: [id number] DOB: October 15, 1975    ADMISSION DATE:  01/16/2016 CONSULTATION DATE:  01/14/2016  REFERRING MD:  Dr, Langley Gauss / [facility]   CHIEF COMPLAINT:  Weakness / Dizziness   BRIEF:  40 y/o F with PMH of tubal ligation, HTN,seizures who presented to [facility] on 10/30 with a 6 day history of weakness, dizziness and difficulty seeing out of her left eye.    Initial evaluation found the patient to be significantly hypertensive with presenting pressure of 224/160 (MAP 81).  Initial labs Na 135, K 3.6, Cl 93, AG 24, BUN 36, sr cr 2.94, glucose 184, mag 2.5, AST 31/ALT 14, alk phos 136, TSH 0.37, WBC 11.3, Hgb 11, and platelets 51.  UA was negative.  She was treated with captopril, ativan, amlodipine and labetalol to reduce blood pressure.  The patient was admitted to [facility] for further evaluation.  She later had a seizure.  Per report, she was felt to be post ictal while on the medical floor.  Am of 10/31, she remained altered and pupils were unequal.  Follow up CT of the head was obtained which demonstrated an abnormal new hypodensity in the head of the right caudate nucleus extending into the right lentiform nucleus, acute or early subacute infarct in this vicinity.  The patient was intubated and transferred to [facility] for further evaluation.    STUDIES:  EEG 10/31:  Mild generalized nonspecific continuous slowing of cerebral activity which can be seen with sedating medications as well as metabolic and toxic encephalopathies. No evidence of epileptiform activity was recorded. MRI/MRA Head 11/1 >> Extensive edema affecting bilateral pons and medulla with associated brainstem swelling. No associated hemorrhage. Superimposed scattered acute infarcts both cerebral hemispheres.  [place] Left Breast 11/1 >>  Renal Ultrasound: No significant abnormality   MICROBIOLOGY: MRSA PCR 10/31:  Negative Blood Cx 11/1:  NGTD  Breast Wound Culture: Pending   ANTIBIOTICS: None   LINES/TUBES: OETT 10/31 >>  OGT 10/31 >> FOLEY 10/31 >> PIV x2  SIGNIFICANT EVENTS: 10/30 - Admit to [facility] with weakness, dizziness.  Hypertensive emergency.  Seizure > AMS,  10/31 - AMS continued, CT head worrisome for CVA, transferred to [facility] 11/1: MRI findings perhaps an usual appearance of Severe Posterior Reversible Encephalopathy Syndrome.   SUBJECTIVE:   Given Versed for potential seizure like activity on 11/1. Has not woken to any stimuli. No sedation requirement right now. O2 saturations stable through movement this morning.    VITAL SIGNS: BP 94/64   Pulse (!) 115   Temp 100.1 F (37.8 C) (Oral)   Resp (!) 21   Wt 161 lb 2.5 oz (73.1 kg)   LMP  (LMP Unknown)   SpO2 100%   BMI 26.01 kg/m   HEMODYNAMICS:    VENTILATOR SETTINGS: Vent Mode: PSV;CPAP FiO2 (%):  [40 %-100 %] 40 % Set Rate:  [12 bmp] 12 bmp Vt Set:  [400 mL] 400 mL PEEP:  [5 cmH20] 5 cmH20 Pressure Support:  [8 cmH20] 8 cmH20 Plateau Pressure:  [12 cmH20-16 cmH20] 12 cmH20  INTAKE / OUTPUT: I/O last 3 completed shifts: In: 287 [I.V.:287] Out: 530 [Urine:530]  PHYSICAL EXAMINATION: General:  No acute distress. Sedated.  Integument:  Warm & dry. No rash on exposed skin. No bruising. HEENT:  No scleral injection or icterus. Endotracheal tube in place.  Cardiovascular:  Regular rate. No edema. No appreciable JVD.  Pulmonary:  Clear bilaterally to auscultation. Symmetric  chest wall rise on ventilator. Abdomen: Soft. Normal bowel sounds. Nondistended.  Musculoskeletal:  No joint deformity or effusion appreciated. Neurological:  No withdrawal to pain in extremities. No spontaneous movements.   LABS:  BMET  Recent Labs Lab 02/09/2016 1711 02/12/16 0444 02/13/16 0215  NA 138 138 138  K 4.1 4.8 5.2*  CL 104 107 105  CO2 20* 18* 18*  BUN 56* 64* 85*  CREATININE 6.47* 7.13* 8.60*  GLUCOSE 136* 115* 131*     Electrolytes  Recent Labs Lab 02/09/2016 1711 02/12/16 0444 02/13/16 0215  CALCIUM 8.3* 8.4* 8.5*  MG 2.3 2.3 2.3  PHOS 6.7* 6.9* 8.6*    CBC  Recent Labs Lab 01/12/2016 1711 02/12/16 0444 02/13/16 0215  WBC 16.8* 13.5* 11.2*  HGB 8.2* 7.7* 7.6*  HCT 24.7* 22.9* 22.4*  PLT 133*  130* 138* 198    Coag's  Recent Labs Lab 01/28/2016 1711 02/13/16 0215  APTT 31 31  INR 1.17  1.22 1.38    Sepsis Markers  Recent Labs Lab 02/12/16 1825 02/13/16 0215  PROCALCITON 0.73 8.77    ABG  Recent Labs Lab 02/06/2016 1625  PHART 7.392  PCO2ART 29.6*  PO2ART 169*    Liver Enzymes  Recent Labs Lab 01/14/2016 1711 02/13/16 0215  AST 29  --   ALT 18  --   ALKPHOS 100  --   BILITOT 0.4  --   ALBUMIN 3.1* 2.5*    Cardiac Enzymes  Recent Labs Lab 02/12/16 1034 02/12/16 1701 02/12/16 2215  TROPONINI 1.97* 1.35* 2.23*    Glucose  Recent Labs Lab 02/12/16 1143  GLUCAP 114*    Imaging Mr Jodene Nam Head Wo Contrast  Result Date: 02/12/2016 CLINICAL DATA:  40 year old female with personal history of hypertension, originally presenting with dizziness and then became unresponsive and had seizure activity. Abnormal head CT. Transferred to the ICU. Initial encounter. EXAM: MRI HEAD WITHOUT CONTRAST MRA HEAD WITHOUT CONTRAST TECHNIQUE: Multiplanar, multiecho pulse sequences of the brain and surrounding structures were obtained without intravenous contrast. Angiographic images of the head were obtained using MRA technique without contrast. COMPARISON:  [facility] noncontrast head CTs 02/02/2016 and earlier FINDINGS: MRI HEAD FINDINGS Brain: There are scattered mostly small foci of restricted diffusion in the bilateral deep gray matter nuclei (including both the right basal ganglia and right thalamus), in both occipital poles, occasionally in both frontal lobes and parietal lobes, and more confluently affecting both cerebellar hemispheres, worse on the left.  Left cerebellar restricted diffusion encompasses 4-5 cm. There is associated T2 and FLAIR hyperintensity in those areas, but there is profound abnormal T2 and FLAIR hyperintensity throughout the bilateral pons and medulla which corresponds to facilitated diffusion on ADC. Furthermore, the cerebral hemispheres do not demonstrate additional gray and white matter T2 and FLAIR hyperintensity outside of the restricted areas. Also, there is no evidence of associated acute intracranial hemorrhage. Subsequently there is some effacement of the pre pontine and pre medullary cisterns. There is mild mass effect on the fourth ventricle but no ventriculomegaly. No midline shift. No impending herniation. No underlying cortical encephalomalacia or chronic cerebral blood products. Negative pituitary. No extra-axial collection. Mesial temporal lobe structures appear normal. Vascular: Major intracranial vascular flow voids are preserved. Skull and upper cervical spine: Grossly negative. Sinuses/Orbits: Negative. Other: Visible internal auditory structures appear normal. Mastoids are clear. Negative scalp soft tissues. MRA HEAD FINDINGS Antegrade flow in both distal vertebral arteries. No distal vertebral stenosis. Both PICA origins are patent. Patent vertebrobasilar junction. No  basilar stenosis. SCA and PCA origins are patent. Posterior communicating arteries are diminutive or absent. Bilateral PCA branches are perhaps mildly irregular but otherwise normal. Antegrade flow in both ICA siphons. No siphon stenosis. Both carotid termini are patent. There is mild right ACA A1 segment irregularity and stenosis. Anterior communicating artery and visualized bilateral ACA branches are within normal limits. Both MCA M1 segments are patent. Both MCA bifurcations are patent. Visualized bilateral MCA branches are within normal limits. IMPRESSION: 1. Extensive edema affecting the bilateral pons and medulla with associated brainstem swelling. No  associated hemorrhage. 2. 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 No associated hemorrhage or mass effect. 3. Intracranial MRA is negative for emergent large vessel occlusion. There is at most mild irregularity of the bilateral PCAs and the right ACA. 4. No superimposed non infarct related hemispheric gray or white matter signal abnormality. 5. This constellation is unusual. Perhaps this is usual appearance of Severe PRES (posterior reversible encephalopathy syndrome) given the clinical presentation with hypertension and seizure. Otherwise consider an osmotic demyelination or severe metabolic encephalopathy with superimposed acute embolic infarcts. Electronically Signed   By: Genevie Ann M.D.   On: 02/12/2016 16:47   Mr Brain Wo Contrast  Result Date: 02/12/2016 CLINICAL DATA:  40 year old female with personal history of hypertension, originally presenting with dizziness and then became unresponsive and had seizure activity. Abnormal head CT. Transferred to the ICU. Initial encounter. EXAM: MRI HEAD WITHOUT CONTRAST MRA HEAD WITHOUT CONTRAST TECHNIQUE: Multiplanar, multiecho pulse sequences of the brain and surrounding structures were obtained without intravenous contrast. Angiographic images of the head were obtained using MRA technique without contrast. COMPARISON:  [facility] noncontrast head CTs 01/19/2016 and earlier FINDINGS: MRI HEAD FINDINGS Brain: There are scattered mostly small foci of restricted diffusion in the bilateral deep gray matter nuclei (including both the right basal ganglia and right thalamus), in both occipital poles, occasionally in both frontal lobes and parietal lobes, and more confluently affecting both cerebellar hemispheres, worse on the left. Left cerebellar restricted diffusion encompasses 4-5 cm. There is associated T2 and FLAIR  hyperintensity in those areas, but there is profound abnormal T2 and FLAIR hyperintensity throughout the bilateral pons and medulla which corresponds to facilitated diffusion on ADC. Furthermore, the cerebral hemispheres do not demonstrate additional gray and white matter T2 and FLAIR hyperintensity outside of the restricted areas. Also, there is no evidence of associated acute intracranial hemorrhage. Subsequently there is some effacement of the pre pontine and pre medullary cisterns. There is mild mass effect on the fourth ventricle but no ventriculomegaly. No midline shift. No impending herniation. No underlying cortical encephalomalacia or chronic cerebral blood products. Negative pituitary. No extra-axial collection. Mesial temporal lobe structures appear normal. Vascular: Major intracranial vascular flow voids are preserved. Skull and upper cervical spine: Grossly negative. Sinuses/Orbits: Negative. Other: Visible internal auditory structures appear normal. Mastoids are clear. Negative scalp soft tissues. MRA HEAD FINDINGS Antegrade flow in both distal vertebral arteries. No distal vertebral stenosis. Both PICA origins are patent. Patent vertebrobasilar junction. No basilar stenosis. SCA and PCA origins are patent. Posterior communicating arteries are diminutive or absent. Bilateral PCA branches are perhaps mildly irregular but otherwise normal. Antegrade flow in both ICA siphons. No siphon stenosis. Both carotid termini are patent. There is mild right ACA A1 segment irregularity and stenosis. Anterior communicating artery and visualized bilateral ACA branches are within normal limits. Both MCA M1  segments are patent. Both MCA bifurcations are patent. Visualized bilateral MCA branches are within normal limits. IMPRESSION: 1. Extensive edema affecting the bilateral pons and medulla with associated brainstem swelling. No associated hemorrhage. 2. Superimposed scattered acute infarcts both cerebral hemispheres  affecting anterior and posterior vascular territory use, and also affecting the cerebellum greater on the left. No associated hemorrhage or mass effect. 3. Intracranial MRA is negative for emergent large vessel occlusion. There is at most mild irregularity of the bilateral PCAs and the right ACA. 4. No superimposed non infarct related hemispheric gray or white matter signal abnormality. 5. This constellation is unusual. Perhaps this is usual appearance of Severe PRES (posterior reversible encephalopathy syndrome) given the clinical presentation with hypertension and seizure. Otherwise consider an osmotic demyelination or severe metabolic encephalopathy with superimposed acute embolic infarcts. Electronically Signed   By: Genevie Ann M.D.   On: 02/12/2016 16:47   US Renal Port  Result Date: 02/12/2016 CLINICAL DATA:  Thrombocytopenia.  Hypertension. EXAM: RENAL / URINARY TRACT ULTRASOUND COMPLETE COMPARISON:  None. FINDINGS: Right Kidney: Length: 10.1 cm. Echogenicity within normal limits. No mass or hydronephrosis visualized. Left Kidney: Length: 10.1 cm. Echogenicity within normal limits. No mass or hydronephrosis visualized. Bladder: Decompressed around a Foley catheter IMPRESSION: No significant abnormality. Electronically Signed   By: Andreas Newport M.D.   On: 02/12/2016 22:25    ASSESSMENT / PLAN:  NEUROLOGIC A:   Acute Encephalopathy - Unclear etiology. CVA vs PRES vs TTP. 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 MRI with extensive edema without hemorrhage. Superimposed scattered acute infarcts.  Seizure - Occurred w/ correction of hypertensive urgency/emergency. H/O Seizure Disorder  P:   RASS goal: 0  Neurology Consulted & following Seizure Precautions AEDs per Neurology:  Trileptal Versed IV prn Seizure & Sedation Fentanyl IV prn Pain/Discomfort   CARDIOVASCULAR A:  Hypertensive Emergency - Initially.  Hypotension  Elevated Troponin I - Likely subendocardial ischemia. H/O HTN  - No medications as an outpatient.  P:  Continuous Telemetry Monitoring Vitals per Unit Protocol Goal SBP >65 D/C Levophed  Checking Complete Echocardiogram  RENAL A:   Acute Renal Failure - Most likely ischemic ATN.  Metabolic Acidosis - Mild. Hyperphosphatemia - Likely due to acute renal failure.  P:   Trending UOP Monitoring renal function & electrolytes daily Nephrology following, challenge with Lasix 80 mg today and monitor UOP, will need dialysis in 24h if UOP decreases or renal function worsens  Replacing electrolytes as indicated  HEMATOLOGIC/ONCOLOGIC A:   Anemia - No signs of active bleeding. Thrombocytopenia - Mild and improving. Schistocytes on smear. Possible TTP with altered mental status. Left Breast Mass  P:  Trending Cell Counts daily w/ CBC Trending Coags daily SCDs Hematology/Oncology following  Left Breast Ultrasound & Probable Biopsy by IR  PULMONARY A: Acute Respiratory Failure - Unable to protect airway with altered mental status.   P:   Full Vent Support Intermittent ABG & Portable CXR Weaning FiO2 for Sat >92% Albuterol neb prn  GASTROINTESTINAL A:   No acute issues.  P:   NPO Pepcid IV q24hr Holding on Tube feedings for now  INFECTIOUS A:   No evidence of acute infection.  P:   Monitoring for signs Blood cultures drawn   ENDOCRINE A:   No acute issues.   P:   Monitor glucose with daily labs.   FAMILY  - Updates: No family at bedside    Phill Myron, [place]. 02/13/2016, 8:44 AM PGY-2, [place]

## 2016-02-13 NOTE — Progress Notes (Signed)
Patient ID: Samantha Pittman, female   DOB: 11-08-1975, 40 y.o.   MRN: [id number]  [facility] Progress Note   Assessment/ Plan:   1. Acute renal failure: Injury suspected to be hemodynamically mediated with ischemic ATN. 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 Oliguric overnight with about 490 mL urine output-will attempt to augment urine output (and also reduce potassium) by challenging with intravenous furosemide 80 mg and monitor urine output/response. No acute indications for dialysis yet however, if renal function continues to worsen and urine output diminishes-will need it within the next 24 hours. Would recommend CRRT to avoid hemodynamic fluctuations particularly with regards to findings on her MRI. 2. Malignant hypertension: Intermittently hypotensive off of antihypertensive therapy. Continue to monitor. 3. Anemia/thrombocytopenia: Platelet count continues to improve-currently 198,000, no evidence of TTP/HUS but may have DIC associated with malignancy. 4. Left breast mass: Ongoing evaluation for possible malignancy-high index of suspicion for this. 5. Seizure disorder/altered mentation:  MRI of the brain raising suspicion for posterior reversible encephalopathy syndrome-imaging also suggestive of metabolic encephalopathy versus osmotic demyelination (no clinical evidence for the latter). Subjective:   No acute events noted overnight, MRI results noted.    Objective:   BP 94/64   Pulse (!) 115   Temp 100.1 F (37.8 C) (Oral)   Resp (!) 21   Wt 73.1 kg (161 lb 2.5 oz)   LMP  (LMP Unknown)   SpO2 100%   BMI 26.01 kg/m   Intake/Output Summary (Last 24 hours) at 02/13/16 0825 Last data filed at 02/13/16 0700  Gross per 24 hour  Intake              190 ml  Output              490 ml  Net             -300 ml   Weight change: -1.8 kg (-3 lb 15.5 oz)  Physical  Exam: [id number], sedated, malodorous CVS: Regular tachycardia, S1 and S2 with ejection systolic murmur Resp: Clear to auscultation bilaterally, no rales Abd: Soft, flat, nontender Ext: Trace lower extremity edema  Imaging: Mr Virgel Paling [id number] Contrast  Result Date: 02/12/2016 CLINICAL DATA:  40 year old female with personal history of hypertension, originally presenting with dizziness and then became unresponsive and had seizure activity. Abnormal head CT. Transferred to the ICU. Initial encounter. EXAM: MRI HEAD WITHOUT CONTRAST MRA HEAD WITHOUT CONTRAST TECHNIQUE: Multiplanar, multiecho pulse sequences of the brain and surrounding structures were obtained without intravenous contrast. Angiographic images of the head were obtained using MRA technique without contrast. COMPARISON:  [facility] noncontrast head CTs 01/15/2016 and earlier FINDINGS: MRI HEAD FINDINGS Brain: There are scattered mostly small foci of restricted diffusion in the bilateral deep gray matter nuclei (including both the right basal ganglia and right thalamus), in both occipital poles, occasionally in both frontal lobes and parietal lobes, and more confluently affecting both cerebellar hemispheres, worse on the left. Left cerebellar restricted diffusion encompasses 4-5 cm. There is associated T2 and FLAIR hyperintensity in those areas, but there is profound abnormal T2 and FLAIR hyperintensity throughout the bilateral pons and medulla which corresponds to facilitated diffusion on ADC. Furthermore, the cerebral hemispheres do not demonstrate additional gray and white matter T2 and FLAIR hyperintensity outside of the restricted areas. Also, there is no evidence of associated acute intracranial hemorrhage. Subsequently there is some effacement of the pre pontine and pre medullary cisterns. There is  mild mass effect on the fourth ventricle but no ventriculomegaly. No midline shift. No impending herniation. No underlying  cortical encephalomalacia or chronic cerebral blood products. Negative pituitary. No extra-axial collection. Mesial temporal lobe structures appear normal. Vascular: Major intracranial vascular flow voids are preserved. Skull and upper cervical spine: Grossly negative. Sinuses/Orbits: Negative. Other: Visible internal auditory structures appear normal. Mastoids are clear. Negative scalp soft tissues. MRA HEAD FINDINGS Antegrade flow in both distal vertebral arteries. No distal vertebral stenosis. Both PICA origins are patent. Patent vertebrobasilar junction. No basilar stenosis. SCA and PCA origins are patent. Posterior communicating arteries are diminutive or absent. Bilateral PCA branches are perhaps mildly irregular but otherwise normal. Antegrade flow in both ICA siphons. No siphon stenosis. Both carotid termini are patent. There is mild right ACA A1 segment irregularity and stenosis. Anterior communicating artery and visualized bilateral ACA branches are within normal limits. Both MCA M1 segments are patent. Both MCA bifurcations are patent. Visualized bilateral MCA branches are within normal limits. IMPRESSION: 1. Extensive edema affecting the bilateral pons and medulla with associated brainstem swelling. No associated hemorrhage. 2. 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 No associated hemorrhage or mass effect. 3. Intracranial MRA is negative for emergent large vessel occlusion. There is at most mild irregularity of the bilateral PCAs and the right ACA. 4. No superimposed non infarct related hemispheric gray or white matter signal abnormality. 5. This constellation is unusual. Perhaps this is usual appearance of Severe PRES (posterior reversible encephalopathy syndrome) given the clinical presentation with hypertension and seizure. Otherwise consider an osmotic demyelination  or severe metabolic encephalopathy with superimposed acute embolic infarcts. Electronically Signed   By: Genevie Ann M.D.   On: 02/12/2016 16:47   Mr Brain Wo Contrast  Result Date: 02/12/2016 CLINICAL DATA:  40 year old female with personal history of hypertension, originally presenting with dizziness and then became unresponsive and had seizure activity. Abnormal head CT. Transferred to the ICU. Initial encounter. EXAM: MRI HEAD WITHOUT CONTRAST MRA HEAD WITHOUT CONTRAST TECHNIQUE: Multiplanar, multiecho pulse sequences of the brain and surrounding structures were obtained without intravenous contrast. Angiographic images of the head were obtained using MRA technique without contrast. COMPARISON:  [facility] noncontrast head CTs 02/10/2016 and earlier FINDINGS: MRI HEAD FINDINGS Brain: There are scattered mostly small foci of restricted diffusion in the bilateral deep gray matter nuclei (including both the right basal ganglia and right thalamus), in both occipital poles, occasionally in both frontal lobes and parietal lobes, and more confluently affecting both cerebellar hemispheres, worse on the left. Left cerebellar restricted diffusion encompasses 4-5 cm. There is associated T2 and FLAIR hyperintensity in those areas, but there is profound abnormal T2 and FLAIR hyperintensity throughout the bilateral pons and medulla which corresponds to facilitated diffusion on ADC. Furthermore, the cerebral hemispheres do not demonstrate additional gray and white matter T2 and FLAIR hyperintensity outside of the restricted areas. Also, there is no evidence of associated acute intracranial hemorrhage. Subsequently there is some effacement of the pre pontine and pre medullary cisterns. There is mild mass effect on the fourth ventricle but no ventriculomegaly. No midline shift. No impending herniation. No underlying cortical encephalomalacia or chronic cerebral blood products. Negative pituitary. No extra-axial  collection. Mesial temporal lobe structures appear normal. Vascular: Major intracranial vascular flow voids are preserved. Skull and upper cervical spine: Grossly negative. Sinuses/Orbits: Negative. Other: Visible internal auditory structures appear normal. Mastoids are clear.  Negative scalp soft tissues. MRA HEAD FINDINGS Antegrade flow in both distal vertebral arteries. No distal vertebral stenosis. Both PICA origins are patent. Patent vertebrobasilar junction. No basilar stenosis. SCA and PCA origins are patent. Posterior communicating arteries are diminutive or absent. Bilateral PCA branches are perhaps mildly irregular but otherwise normal. Antegrade flow in both ICA siphons. No siphon stenosis. Both carotid termini are patent. There is mild right ACA A1 segment irregularity and stenosis. Anterior communicating artery and visualized bilateral ACA branches are within normal limits. Both MCA M1 segments are patent. Both MCA bifurcations are patent. Visualized bilateral MCA branches are within normal limits. IMPRESSION: 1. Extensive edema affecting the bilateral pons and medulla with associated brainstem swelling. No associated hemorrhage. 2. 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 No associated hemorrhage or mass effect. 3. Intracranial MRA is negative for emergent large vessel occlusion. There is at most mild irregularity of the bilateral PCAs and the right ACA. 4. No superimposed non infarct related hemispheric gray or white matter signal abnormality. 5. This constellation is unusual. Perhaps this is usual appearance of Severe PRES (posterior reversible encephalopathy syndrome) given the clinical presentation with hypertension and seizure. Otherwise consider an osmotic demyelination or severe metabolic encephalopathy with superimposed acute embolic infarcts. Electronically  Signed   By: Genevie Ann M.D.   On: 02/12/2016 16:47   US Renal Port  Result Date: 02/12/2016 CLINICAL DATA:  Thrombocytopenia.  Hypertension. EXAM: RENAL / URINARY TRACT ULTRASOUND COMPLETE COMPARISON:  None. FINDINGS: Right Kidney: Length: 10.1 cm. Echogenicity within normal limits. No mass or hydronephrosis visualized. Left Kidney: Length: 10.1 cm. Echogenicity within normal limits. No mass or hydronephrosis visualized. Bladder: Decompressed around a Foley catheter IMPRESSION: No significant abnormality. Electronically Signed   By: Andreas Newport M.D.   On: 02/12/2016 22:25   Dg Chest Port 1 View  Result Date: 02/12/2016 CLINICAL DATA:  Status post intubation EXAM: PORTABLE CHEST 1 VIEW COMPARISON:  02/02/2016 FINDINGS: Cardiac shadow is again mildly enlarged. Nasogastric catheter extends to the distal esophagus and is unchanged. Nasogastric catheter is also unchanged stable from the previous exam. The lungs are well aerated bilaterally. No focal infiltrate or sizable effusion is noted. No bony abnormality is seen. IMPRESSION: No significant change from the prior exam. Advancement of the nasogastric catheter is recommended as clinically indicated. Electronically Signed   By: Inez Catalina M.D.   On: 02/12/2016 08:03   Dg Chest Port 1 View  Result Date: 02/06/2016 CLINICAL DATA:  Initial evaluation for post transport ventilated patient. EXAM: PORTABLE CHEST 1 VIEW COMPARISON:  None available. FINDINGS: Patient is intubated with the tip of an endotracheal tube positioned approximately 3.5 cm above the carina. Enteric tube in place, with tip just above the GE junction. Side hole in distal esophagus. Cardiac and mediastinal silhouettes within normal limits. Lungs normally inflated. No focal infiltrate, pulmonary edema, or pleural effusion. No pneumothorax. Linear opacity within the right lung base likely reflect atelectasis/scar. No acute osseous abnormality. IMPRESSION: 1. Tip of the endotracheal tube in  satisfactory position approximately 3.5 cm above the carina. 2. Tip of enteric tube just above the GE junction, with side hole in the distal esophagus. Advancement recommended. 3. No radiographic evidence for active cardiopulmonary disease. Electronically Signed   By: Jeannine Boga M.D.   On: 02/07/2016 16:47    Labs: BMET  Recent Labs Lab 01/27/2016 1711 02/12/16 0444 02/13/16 0215  NA 138  138 138  K 4.1 4.8 5.2*  CL 104 107 105  CO2 20* 18* 18*  GLUCOSE 136* 115* 131*  BUN 56* 64* 85*  CREATININE 6.47* 7.13* 8.60*  CALCIUM 8.3* 8.4* 8.5*  PHOS 6.7* 6.9* 8.6*   CBC  Recent Labs Lab 02/10/2016 1711 02/12/16 0444 02/13/16 0215  WBC 16.8* 13.5* 11.2*  NEUTROABS 15.3*  --  10.0*  HGB 8.2* 7.7* 7.6*  HCT 24.7* 22.9* 22.4*  MCV 88.8 90.2 88.5  PLT 133*  130* 138* 198    Medications:    . chlorhexidine  15 mL Mouth Rinse BID  . famotidine (PEPCID) IV  20 mg Intravenous Q24H  . furosemide  80 mg Intravenous Once  . mouth rinse  15 mL Mouth Rinse 10 times per day  . OXcarbazepine  900 mg Per Tube BID   Elmarie Shiley, MD 02/13/2016, 8:25 AM

## 2016-02-13 NOTE — Progress Notes (Signed)
STROKE TEAM PROGRESS NOTE   HISTORY OF PRESENT ILLNESS (per record) Colton Revels is an 40 yo female with hx of seizures and HTN presented to outside hospital with hypertensive urgency. BP was lowered quickly and had a seizure. 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 She was intubated for airway protection and transferred to [facility] for further evaluation.   Patient was not administered IV t-PA. She was admitted to the neuro ICU for further evaluation and treatment.   SUBJECTIVE (INTERVAL HISTORY) Patient remains intubated and obtunded and unresponsive. Blood pressure is better controlled. She has had no further seizures.Marland KitchenMRI scan of the brain done yesterday shows extensive edema bilateral pons middle the brainstem swelling along with superimposed scattered acute infarcts in both cerebral hemispheres affecting basal ganglia and cerebellum. The large intracranial vessels appear to be patent. 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 I spoke to the patient's mother and sister today and obtained additional history that patient had no history of hypertension but was off medications for several years. She developed an episode of severe diarrhea followed by generalized weakness for a day prior to admission and was initially favored to speak and communicate and walk before hers sudden neurological decline.Family was unaware of her left breast mass. Patient spiked temperature 102 earlier today. Blood cultures were sent last night but INR pending. White count is trending down but remains elevated at 11.2  OBJECTIVE Temp:  [98.6 F (37 C)-103.1 F (39.5 C)] 99.6 F (37.6 C) (11/02 1144) Pulse Rate:  [98-117] 99 (11/02 1300) Cardiac Rhythm: Sinus tachycardia (11/02 0800) Resp:  [15-29] 16 (11/02 1300) BP:  (89-154)/(59-110) 128/75 (11/02 1300) SpO2:  [91 %-100 %] 97 % (11/02 1300) FiO2 (%):  [40 %] 40 % (11/02 1153) Weight:  [161 lb 2.5 oz (73.1 kg)] 161 lb 2.5 oz (73.1 kg) (11/02 0050)  CBC:   Recent Labs Lab 02/10/2016 1711 02/12/16 0444 02/13/16 0215  WBC 16.8* 13.5* 11.2*  NEUTROABS 15.3*  --  10.0*  HGB 8.2* 7.7* 7.6*  HCT 24.7* 22.9* 22.4*  MCV 88.8 90.2 88.5  PLT 133*  130* 138* [id number]    Basic Metabolic Panel:   Recent Labs Lab 02/12/16 0444 02/13/16 0215  NA 138 138  K 4.8 5.2*  CL 107 105  CO2 18* 18*  GLUCOSE 115* 131*  BUN 64* 85*  CREATININE 7.13* 8.60*  CALCIUM 8.4* 8.5*  MG 2.3 2.3  PHOS 6.9* 8.6*    Lipid Panel:     Component Value Date/Time   CHOL 183 02/13/2016 0215   TRIG 83 02/13/2016 0215   HDL 66 02/13/2016 0215   CHOLHDL 2.8 02/13/2016 0215   VLDL 17 02/13/2016 0215   LDLCALC 100 (H) 02/13/2016 0215   HgbA1c: No results found for: HGBA1C Urine Drug Screen:     Component Value Date/Time   LABOPIA NONE DETECTED 02/12/2016 1319   COCAINSCRNUR NONE DETECTED 02/12/2016 1319   LABBENZ NONE DETECTED 02/12/2016 1319   AMPHETMU NONE DETECTED 02/12/2016 1319   THCU NONE DETECTED 02/12/2016 1319   LABBARB NONE DETECTED 02/12/2016 1319      IMAGING  Mr Jodene Nam Head Wo Contrast  Result Date: 02/12/2016 CLINICAL DATA:  40 year old female with personal history of hypertension, originally presenting with dizziness and then became unresponsive and had seizure activity. Abnormal head CT. Transferred to the  ICU. Initial encounter. EXAM: MRI HEAD WITHOUT CONTRAST MRA HEAD WITHOUT CONTRAST TECHNIQUE: Multiplanar, multiecho pulse sequences of the brain and surrounding structures were obtained without intravenous contrast. Angiographic images of the head were obtained using MRA technique without contrast. COMPARISON:  [facility] noncontrast head CTs 01/21/2016 and earlier FINDINGS: MRI HEAD FINDINGS Brain: There are scattered mostly small foci of  restricted diffusion in the bilateral deep gray matter nuclei (including both the right basal ganglia and right thalamus), in both occipital poles, occasionally in both frontal lobes and parietal lobes, and more confluently affecting both cerebellar hemispheres, worse on the left. Left cerebellar restricted diffusion encompasses 4-5 cm. There is associated T2 and FLAIR hyperintensity in those areas, but there is profound abnormal T2 and FLAIR hyperintensity throughout the bilateral pons and medulla which corresponds to facilitated diffusion on ADC. Furthermore, the cerebral hemispheres do not demonstrate additional gray and white matter T2 and FLAIR hyperintensity outside of the restricted areas. Also, there is no evidence of associated acute intracranial hemorrhage. Subsequently there is some effacement of the pre pontine and pre medullary cisterns. There is mild mass effect on the fourth ventricle but no ventriculomegaly. No midline shift. No impending herniation. No underlying cortical encephalomalacia or chronic cerebral blood products. Negative pituitary. No extra-axial collection. Mesial temporal lobe structures appear normal. Vascular: Major intracranial vascular flow voids are preserved. Skull and upper cervical spine: Grossly negative. Sinuses/Orbits: Negative. Other: Visible internal auditory structures appear normal. Mastoids are clear. Negative scalp soft tissues. MRA HEAD FINDINGS Antegrade flow in both distal vertebral arteries. No distal vertebral stenosis. Both PICA origins are patent. Patent vertebrobasilar junction. No basilar stenosis. SCA and PCA origins are patent. Posterior communicating arteries are diminutive or absent. Bilateral PCA branches are perhaps mildly irregular but otherwise normal. Antegrade flow in both ICA siphons. No siphon stenosis. Both carotid termini are patent. There is mild right ACA A1 segment irregularity and stenosis. Anterior communicating artery and visualized  bilateral ACA branches are within normal limits. Both MCA M1 segments are patent. Both MCA bifurcations are patent. Visualized bilateral MCA branches are within normal limits. IMPRESSION: 1. Extensive edema affecting the bilateral pons and medulla with associated brainstem swelling. No associated hemorrhage. 2. 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 No associated hemorrhage or mass effect. 3. Intracranial MRA is negative for emergent large vessel occlusion. There is at most mild irregularity of the bilateral PCAs and the right ACA. 4. No superimposed non infarct related hemispheric gray or white matter signal abnormality. 5. This constellation is unusual. Perhaps this is usual appearance of Severe PRES (posterior reversible encephalopathy syndrome) given the clinical presentation with hypertension and seizure. Otherwise consider an osmotic demyelination or severe metabolic encephalopathy with superimposed acute embolic infarcts. Electronically Signed   By: Genevie Ann M.D.   On: 02/12/2016 16:47   Mr Brain Wo Contrast  Result Date: 02/12/2016 CLINICAL DATA:  40 year old female with personal history of hypertension, originally presenting with dizziness and then became unresponsive and had seizure activity. Abnormal head CT. Transferred to the ICU. Initial encounter. EXAM: MRI HEAD WITHOUT CONTRAST MRA HEAD WITHOUT CONTRAST TECHNIQUE: Multiplanar, multiecho pulse sequences of the brain and surrounding structures were obtained without intravenous contrast. Angiographic images of the head were obtained using MRA technique without contrast. COMPARISON:  [facility] noncontrast head CTs 01/23/2016 and earlier FINDINGS: MRI HEAD FINDINGS Brain: There are scattered mostly small foci of restricted  diffusion in the bilateral deep gray matter nuclei (including both the right  basal ganglia and right thalamus), in both occipital poles, occasionally in both frontal lobes and parietal lobes, and more confluently affecting both cerebellar hemispheres, worse on the left. Left cerebellar restricted diffusion encompasses 4-5 cm. There is associated T2 and FLAIR hyperintensity in those areas, but there is profound abnormal T2 and FLAIR hyperintensity throughout the bilateral pons and medulla which corresponds to facilitated diffusion on ADC. Furthermore, the cerebral hemispheres do not demonstrate additional gray and white matter T2 and FLAIR hyperintensity outside of the restricted areas. Also, there is no evidence of associated acute intracranial hemorrhage. Subsequently there is some effacement of the pre pontine and pre medullary cisterns. There is mild mass effect on the fourth ventricle but no ventriculomegaly. No midline shift. No impending herniation. No underlying cortical encephalomalacia or chronic cerebral blood products. Negative pituitary. No extra-axial collection. Mesial temporal lobe structures appear normal. Vascular: Major intracranial vascular flow voids are preserved. Skull and upper cervical spine: Grossly negative. Sinuses/Orbits: Negative. Other: Visible internal auditory structures appear normal. Mastoids are clear. Negative scalp soft tissues. MRA HEAD FINDINGS Antegrade flow in both distal vertebral arteries. No distal vertebral stenosis. Both PICA origins are patent. Patent vertebrobasilar junction. No basilar stenosis. SCA and PCA origins are patent. Posterior communicating arteries are diminutive or absent. Bilateral PCA branches are perhaps mildly irregular but otherwise normal. Antegrade flow in both ICA siphons. No siphon stenosis. Both carotid termini are patent. There is mild right ACA A1 segment irregularity and stenosis. Anterior communicating artery and visualized bilateral ACA branches are within normal limits. Both MCA M1 segments are patent. Both MCA  bifurcations are patent. Visualized bilateral MCA branches are within normal limits. IMPRESSION: 1. Extensive edema affecting the bilateral pons and medulla with associated brainstem swelling. No associated hemorrhage. 2. 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 No associated hemorrhage or mass effect. 3. Intracranial MRA is negative for emergent large vessel occlusion. There is at most mild irregularity of the bilateral PCAs and the right ACA. 4. No superimposed non infarct related hemispheric gray or white matter signal abnormality. 5. This constellation is unusual. Perhaps this is usual appearance of Severe PRES (posterior reversible encephalopathy syndrome) given the clinical presentation with hypertension and seizure. Otherwise consider an osmotic demyelination or severe metabolic encephalopathy with superimposed acute embolic infarcts. Electronically Signed   By: Genevie Ann M.D.   On: 02/12/2016 16:47   US Renal Port  Result Date: 02/12/2016 CLINICAL DATA:  Thrombocytopenia.  Hypertension. EXAM: RENAL / URINARY TRACT ULTRASOUND COMPLETE COMPARISON:  None. FINDINGS: Right Kidney: Length: 10.1 cm. Echogenicity within normal limits. No mass or hydronephrosis visualized. Left Kidney: Length: 10.1 cm. Echogenicity within normal limits. No mass or hydronephrosis visualized. Bladder: Decompressed around a Foley catheter IMPRESSION: No significant abnormality. Electronically Signed   By: Andreas Newport M.D.   On: 02/12/2016 22:25   Dg Chest Port 1 View  Result Date: 02/12/2016 CLINICAL DATA:  Status post intubation EXAM: PORTABLE CHEST 1 VIEW COMPARISON:  01/27/2016 FINDINGS: Cardiac shadow is again mildly enlarged. Nasogastric catheter extends to the distal esophagus and is unchanged. Nasogastric catheter is also unchanged stable from the previous exam. The lungs are well  aerated bilaterally. No focal infiltrate or sizable effusion is noted. No bony abnormality is seen. IMPRESSION: No significant change from the prior exam. Advancement of the nasogastric catheter is recommended as  clinically indicated. Electronically Signed   By: Inez Catalina M.D.   On: 02/12/2016 08:03   Dg Chest Port 1 View  Result Date: 01/23/2016 CLINICAL DATA:  Initial evaluation for post transport ventilated patient. EXAM: PORTABLE CHEST 1 VIEW COMPARISON:  None available. FINDINGS: Patient is intubated with the tip of an endotracheal tube positioned approximately 3.5 cm above the carina. Enteric tube in place, with tip just above the GE junction. Side hole in distal esophagus. Cardiac and mediastinal silhouettes within normal limits. Lungs normally inflated. No focal infiltrate, pulmonary edema, or pleural effusion. No pneumothorax. Linear opacity within the right lung base likely reflect atelectasis/scar. No acute osseous abnormality. IMPRESSION: 1. Tip of the endotracheal tube in satisfactory position approximately 3.5 cm above the carina. 2. Tip of enteric tube just above the GE junction, with side hole in the distal esophagus. Advancement recommended. 3. No radiographic evidence for active cardiopulmonary disease. Electronically Signed   By: Jeannine Boga M.D.   On: 01/22/2016 16:47   EEG  This EEG is abnormal with mild generalized nonspecific continuous slowing of cerebral activity which can be seen with sedating medications as well as metabolic and toxic encephalopathies. No evidence of epileptiform activity was recorded.   PHYSICAL EXAM middle aged african [place] lady who is intubated. Not on sedation. . Afebrile. Head is nontraumatic. Neck is supple without bruit.    Cardiac exam no murmur or gallop. Lungs are clear to auscultation. Distal pulses are well felt. Neurological Exam : Intubated. Comatose. Eyes are closed. Not following any commands. Right pupil is irregular not  reactive. Left pupil 2 mm sluggishly reactive. Corneal reflexes are present bilaterally. Fundi could not be visualized. Doll's eye movements are sluggish. Patient has a cough and gag She does not grimace to facial pain. No spontaneous extremity movements. Patient will withdraw both lower extremities greater than upper extremity distant painful stimuli. No posturing. Deep tendon reflexes are 1+ symmetric. Plantars are downgoing. ASSESSMENT/PLAN [name] is a 40 y.o. female with history of HTN and seizures presenting to outlying hospital with hypertensive emergency followed by seizure. She did not present with stroke symptoms. CT shows a stroke, age indeterminate.    Comatose state with poor neurological exam secondary to extensive brainstem edema and white matter lesions consistent with hypertensive encephalopathy. Superimposed acute infarcts in both cerebral hemispheres etiology unclear whether related to breast or other. Relatively sudden presentation following a GI illness with now fever and elevated white count raise concern for sepsis. Etiology of renal failure likely related to hypovolemia and acute tubular necrosis   Other stroke  CT at outlying hospital with R caudate lacunar infarct MRI/MRA 1. Extensive edema affecting the bilateral pons and medulla with associated brainstem swelling. No associated hemorrhage. 2. 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 No associated hemorrhage or mass effect. 3. Intracranial MRA is negative for emergent large vessel occlusion. There is at most mild irregularity of the bilateral PCAs and the  right ACA.   Carotid Doppler  pending   2D Echo  pending   LDL  100  HgbA1c ordered  SCDs for VTE prophylaxis Diet NPO time specified  No antithrombotic prior to admission, received one dose of aspirin 324 mg  yesterday. No further aspirin given likely TTP  Ongoing aggressive stroke risk factor management  Therapy recommendations:  Will add once stable  Disposition:  pending   Seizure   Not  related to stroke seen on CT  EEG generalized nonspecific slowing  Continue trileptal 300 bid  Continuous EEG  Respiratory failure  Intubated after seizure for airway protection  CCM attending  Hypertensive Emergency  Not on home medications  Stable this am Long-term BP goal normotensive  Other Active Problems  Acute kidney injury, Cr 6.47 possibly ATN  Unlikely TTP, Thrombocytopenia 130. Considering plasma exchange  Mild anemia 8.2  Ulcerating L breast mass  Sepsis  Hospital day # 2 I have personally examined this patient, reviewed notes, independently viewed imaging studies, participated in medical decision making and plan of care.ROS completed by me personally and pertinent positives fully documented  I have made any additions or clarifications directly to the above note. Plan to  consider starting antibiotics for sepsis. Maintain adequate intravascular volume. Breast lesion biopsy pending. Discussed with Dr. Chase Caller, patient's mother and  Sister and answered questions. This patient is critically ill and at significant risk of neurological worsening, death and care requires constant monitoring of vital signs, hemodynamics,respiratory and cardiac monitoring, extensive review of multiple databases, frequent neurological assessment, discussion with family, other specialists and medical decision making of high complexity.I have made any additions or clarifications directly to the above note.This critical care time does not reflect procedure time, or teaching time or supervisory time of PA/NP/Med Resident etc but could involve care discussion time.  I spent 50 minutes of neurocritical care time  in the care of  this patient.      Antony Contras, MD Medical Director [facility] Pager: [phone] 02/13/2016 2:27 PM     To contact Stroke Continuity provider, please refer to [email or web address] After hours, contact General Neurology

## 2016-02-13 NOTE — Progress Notes (Signed)
Echocardiogram 2D Echocardiogram has been performed.  Samantha Pittman 02/13/2016, 2:54 PM

## 2016-02-13 NOTE — Progress Notes (Signed)
*  PRELIMINARY RESULTS* Vascular Ultrasound Carotid Duplex (Doppler) has been completed.  Preliminary findings: Could not obtain images from right carotid due to poor patient position with head turned completely to the right. Tried to reposition patient multiple times but she kept head firmly to the right. Left :No significant (1-39%) ICA stenosis. Antegrade vertebral flow.    Landry Mellow, RDMS, RVT  02/13/2016, 11:19 AM

## 2016-02-13 NOTE — Progress Notes (Signed)
Patient is a 40 y.o. 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 Nephrology consulted and concern that ARF related to ischemic ATN. Trial of Lasix ordered this morning to try to stimulate UOP.   Discussed case with neurology (Dr. Leonie Man) and there is concern for sepsis given history of illness prior to admission recently obtained from family along with T 103 this morning and elevated Procalcitonin to 8.7. Will initiate code sepsis. Broad spectrum antibiotic coverage with Vancomycin and Zosyn per pharmacy. 30 cc/kg bolus ordered.   Called Dr. Posey Pronto (nephrology) who agrees with plan to initiate fluid resuscitation for sepsis.   Phill Myron, D.O. 02/13/2016, 10:57 AM PGY-2, [place]

## 2016-02-13 NOTE — Progress Notes (Signed)
PHARMACY - PHYSICIAN COMMUNICATION CRITICAL VALUE ALERT - BLOOD CULTURE IDENTIFICATION (BCID)  Results for orders placed or performed during the hospital encounter of 02/07/2016  Blood Culture ID Panel (Reflexed) (Collected: 02/12/2016  6:25 PM)  Result Value Ref Range   Enterococcus species NOT DETECTED NOT DETECTED   Listeria monocytogenes NOT DETECTED NOT DETECTED   Staphylococcus species NOT DETECTED NOT DETECTED   Staphylococcus aureus NOT DETECTED NOT DETECTED   Streptococcus species DETECTED (A) NOT DETECTED   Streptococcus agalactiae NOT DETECTED NOT DETECTED   Streptococcus pneumoniae NOT DETECTED NOT DETECTED   Streptococcus pyogenes NOT DETECTED NOT DETECTED   Acinetobacter baumannii NOT DETECTED NOT DETECTED   Enterobacteriaceae species NOT DETECTED NOT DETECTED   Enterobacter cloacae complex NOT DETECTED NOT DETECTED   Escherichia coli NOT DETECTED NOT DETECTED   Klebsiella oxytoca NOT DETECTED NOT DETECTED   Klebsiella pneumoniae NOT DETECTED NOT DETECTED   Proteus species NOT DETECTED NOT DETECTED   Serratia marcescens NOT DETECTED NOT DETECTED   Haemophilus influenzae NOT DETECTED NOT DETECTED   Neisseria meningitidis NOT DETECTED NOT DETECTED   Pseudomonas aeruginosa NOT DETECTED NOT DETECTED   Candida albicans NOT DETECTED NOT DETECTED   Candida glabrata NOT DETECTED NOT DETECTED   Candida krusei NOT DETECTED NOT DETECTED   Candida parapsilosis NOT DETECTED NOT DETECTED   Candida tropicalis NOT DETECTED NOT DETECTED    Name of physician (or Provider) Contacted: Dr. Juleen China   Changes to prescribed antibiotics required: No recommendations for change in antibiotic therapy at this time. Patient on appropriate antibiotic therapy for coverage.   Argie Ramming, PharmD Pharmacy Resident  Pager [phone] 02/13/16 11:56 AM

## 2016-02-13 NOTE — Progress Notes (Signed)
eLink Physician-Brief Progress Note Patient Name: Samantha Pittman DOB: March 31, 1976 MRN: [id number]   Date of Service  02/13/2016  HPI/Events of Note  Oliguria - Creatinine = 9.5 and K+ =- 5.3. HCO3- = 15. Nephrology is already on board.   eICU Interventions  Will order: 1. Lasix 120 mg IV now.  2. ABG now (make sure that pH is compensated by ventilator).     Intervention Category Major Interventions: Acid-Base disturbance - evaluation and management  Janayia Burggraf [place] 02/13/2016, 6:42 PM

## 2016-02-14 ENCOUNTER — Inpatient Hospital Stay (HOSPITAL_COMMUNITY): Payer: Medicaid Other

## 2016-02-14 DIAGNOSIS — I6783 Posterior reversible encephalopathy syndrome: Secondary | ICD-10-CM

## 2016-02-14 DIAGNOSIS — N63 Unspecified lump in unspecified breast: Secondary | ICD-10-CM

## 2016-02-14 DIAGNOSIS — N179 Acute kidney failure, unspecified: Secondary | ICD-10-CM

## 2016-02-14 DIAGNOSIS — Z9911 Dependence on respirator [ventilator] status: Secondary | ICD-10-CM

## 2016-02-14 DIAGNOSIS — G9340 Encephalopathy, unspecified: Secondary | ICD-10-CM

## 2016-02-14 LAB — CBC WITH DIFFERENTIAL/PLATELET
BASOS ABS: 0 10*3/uL (ref 0.0–0.1)
BASOS PCT: 0 %
Eosinophils Absolute: 0 10*3/uL (ref 0.0–0.7)
Eosinophils Relative: 0 %
HEMATOCRIT: 22.1 % — AB (ref 36.0–46.0)
Hemoglobin: 7.3 g/dL — ABNORMAL LOW (ref 12.0–15.0)
Lymphocytes Relative: 6 %
Lymphs Abs: 0.7 10*3/uL (ref 0.7–4.0)
MCH: 29.2 pg (ref 26.0–34.0)
MCHC: 33 g/dL (ref 30.0–36.0)
MCV: 88.4 fL (ref 78.0–100.0)
MONO ABS: 0.4 10*3/uL (ref 0.1–1.0)
Monocytes Relative: 4 %
NEUTROS ABS: 9.6 10*3/uL — AB (ref 1.7–7.7)
Neutrophils Relative %: 90 %
PLATELETS: 238 10*3/uL (ref 150–400)
RBC: 2.5 MIL/uL — ABNORMAL LOW (ref 3.87–5.11)
RDW: 15.9 % — AB (ref 11.5–15.5)
WBC: 10.7 10*3/uL — AB (ref 4.0–10.5)

## 2016-02-14 LAB — PROTIME-INR
INR: 1.27
PROTHROMBIN TIME: 16 s — AB (ref 11.4–15.2)

## 2016-02-14 LAB — RENAL FUNCTION PANEL
Albumin: 2.5 g/dL — ABNORMAL LOW (ref 3.5–5.0)
Anion gap: 16 — ABNORMAL HIGH (ref 5–15)
BUN: 110 mg/dL — AB (ref 6–20)
CHLORIDE: 107 mmol/L (ref 101–111)
CO2: 16 mmol/L — AB (ref 22–32)
CREATININE: 9.56 mg/dL — AB (ref 0.44–1.00)
Calcium: 8.8 mg/dL — ABNORMAL LOW (ref 8.9–10.3)
GFR calc Af Amer: 5 mL/min — ABNORMAL LOW (ref >60)
GFR calc non Af Amer: 4 mL/min — ABNORMAL LOW (ref >60)
Glucose, Bld: 108 mg/dL — ABNORMAL HIGH (ref 65–99)
Phosphorus: 10.6 mg/dL — ABNORMAL HIGH (ref 2.5–4.6)
Potassium: 5.3 mmol/L — ABNORMAL HIGH (ref 3.5–5.1)
Sodium: 139 mmol/L (ref 135–145)

## 2016-02-14 LAB — ADAMTS13 ACTIVITY: Adamts 13 Activity: 46.6 % — ABNORMAL LOW (ref >66.8)

## 2016-02-14 LAB — PROCALCITONIN: PROCALCITONIN: 18.68 ng/mL

## 2016-02-14 LAB — ADAMTS13 ACTIVITY REFLEX

## 2016-02-14 LAB — HEMOGLOBIN A1C
Hgb A1c MFr Bld: 4.7 % — ABNORMAL LOW (ref 4.8–5.6)
Mean Plasma Glucose: 88 mg/dL

## 2016-02-14 LAB — FIBRINOGEN: FIBRINOGEN: 706 mg/dL — AB (ref 210–475)

## 2016-02-14 LAB — MAGNESIUM: Magnesium: 2.6 mg/dL — ABNORMAL HIGH (ref 1.7–2.4)

## 2016-02-14 LAB — APTT: APTT: 35 s (ref 24–36)

## 2016-02-14 MED ORDER — HEPARIN SODIUM (PORCINE) 1000 UNIT/ML IJ SOLN
3000.0000 [IU] | Freq: Once | INTRAMUSCULAR | Status: AC
  Administered 2016-02-14: 2400 [IU] via INTRAVENOUS
  Filled 2016-02-14: qty 3

## 2016-02-14 MED ORDER — SODIUM BICARBONATE 650 MG PO TABS
650.0000 mg | ORAL_TABLET | Freq: Three times a day (TID) | ORAL | Status: DC
  Administered 2016-02-14 – 2016-02-17 (×10): 650 mg via NASOGASTRIC
  Filled 2016-02-14 (×13): qty 1

## 2016-02-14 NOTE — Progress Notes (Signed)
Patient ID: Samantha Pittman, female   DOB: 1975/12/19, 40 y.o.   MRN: [id number]  [facility] Progress Note   Assessment/ Plan:   1. Acute renal failure: Injury suspected to be hemodynamically mediated with ischemic ATN. 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 Improvement of urine output noted overnight in response to Lasix-dose repeated this morning because of mild hyperkalemia/metabolic acidosis. She still does not have any acute indications for dialysis-would favor continued efforts at supportive medical management as you currently doing. Will start some oral bicarbonate supplementation. 2. Malignant hypertension: Blood pressures borderline elevated, continue to monitor on when necessary hydralazine. 3. Anemia/thrombocytopenia: Platelet count continues to improve-currently 238,000, no evidence of TTP/HUS but may have DIC associated with malignancy. 4. Left breast mass: Status post biopsy of breast mass yesterday-pathology results pending. 5. Seizure disorder/altered mentation:  MRI of the brain raising suspicion for posterior reversible encephalopathy syndrome-imaging also suggestive of metabolic encephalopathy versus osmotic demyelination (no clinical evidence for the latter). Subjective:   No acute events noted overnight, Improved urine output noted.    Objective:   BP (!) 140/91 (BP Location: Right Arm)   Pulse (!) 104   Temp 98 F (36.7 C) (Oral)   Resp 16   Wt 76.2 kg (167 lb 14.4 oz)   LMP  (LMP Unknown)   SpO2 100%   BMI 27.10 kg/m   Intake/Output Summary (Last 24 hours) at 02/14/16 0737 Last data filed at 02/14/16 [id number]  Gross per 24 hour  Intake             2610 ml  Output             1765 ml  Net              845 ml   Weight change: 3.059 kg (6 lb 11.9 oz)  Physical Exam: [id number], sedated CVS: Regular tachycardia, S1 and S2 with ejection systolic  murmur Resp: Clear to auscultation bilaterally, no rales Abd: Soft, flat, nontender Ext: Trace lower extremity edema  Imaging: Mr Virgel Paling [id number] Contrast  Result Date: 02/12/2016 CLINICAL DATA:  40 year old female with personal history of hypertension, originally presenting with dizziness and then became unresponsive and had seizure activity. Abnormal head CT. Transferred to the ICU. Initial encounter. EXAM: MRI HEAD WITHOUT CONTRAST MRA HEAD WITHOUT CONTRAST TECHNIQUE: Multiplanar, multiecho pulse sequences of the brain and surrounding structures were obtained without intravenous contrast. Angiographic images of the head were obtained using MRA technique without contrast. COMPARISON:  [facility] noncontrast head CTs 02/04/2016 and earlier FINDINGS: MRI HEAD FINDINGS Brain: There are scattered mostly small foci of restricted diffusion in the bilateral deep gray matter nuclei (including both the right basal ganglia and right thalamus), in both occipital poles, occasionally in both frontal lobes and parietal lobes, and more confluently affecting both cerebellar hemispheres, worse on the left. Left cerebellar restricted diffusion encompasses 4-5 cm. There is associated T2 and FLAIR hyperintensity in those areas, but there is profound abnormal T2 and FLAIR hyperintensity throughout the bilateral pons and medulla which corresponds to facilitated diffusion on ADC. Furthermore, the cerebral hemispheres do not demonstrate additional gray and white matter T2 and FLAIR hyperintensity outside of the restricted areas. Also, there is no evidence of associated acute intracranial hemorrhage. Subsequently there is some effacement of the pre pontine and pre medullary cisterns. There is mild mass effect on the fourth ventricle but no ventriculomegaly. No midline shift. No impending herniation. No underlying  cortical encephalomalacia or chronic cerebral blood products. Negative pituitary. No extra-axial  collection. Mesial temporal lobe structures appear normal. Vascular: Major intracranial vascular flow voids are preserved. Skull and upper cervical spine: Grossly negative. Sinuses/Orbits: Negative. Other: Visible internal auditory structures appear normal. Mastoids are clear. Negative scalp soft tissues. MRA HEAD FINDINGS Antegrade flow in both distal vertebral arteries. No distal vertebral stenosis. Both PICA origins are patent. Patent vertebrobasilar junction. No basilar stenosis. SCA and PCA origins are patent. Posterior communicating arteries are diminutive or absent. Bilateral PCA branches are perhaps mildly irregular but otherwise normal. Antegrade flow in both ICA siphons. No siphon stenosis. Both carotid termini are patent. There is mild right ACA A1 segment irregularity and stenosis. Anterior communicating artery and visualized bilateral ACA branches are within normal limits. Both MCA M1 segments are patent. Both MCA bifurcations are patent. Visualized bilateral MCA branches are within normal limits. IMPRESSION: 1. Extensive edema affecting the bilateral pons and medulla with associated brainstem swelling. No associated hemorrhage. 2. 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 No associated hemorrhage or mass effect. 3. Intracranial MRA is negative for emergent large vessel occlusion. There is at most mild irregularity of the bilateral PCAs and the right ACA. 4. No superimposed non infarct related hemispheric gray or white matter signal abnormality. 5. This constellation is unusual. Perhaps this is usual appearance of Severe PRES (posterior reversible encephalopathy syndrome) given the clinical presentation with hypertension and seizure. Otherwise consider an osmotic demyelination or severe metabolic encephalopathy with superimposed acute embolic infarcts. Electronically  Signed   By: Genevie Ann M.D.   On: 02/12/2016 16:47   Mr Brain Wo Contrast  Result Date: 02/12/2016 CLINICAL DATA:  39 year old female with personal history of hypertension, originally presenting with dizziness and then became unresponsive and had seizure activity. Abnormal head CT. Transferred to the ICU. Initial encounter. EXAM: MRI HEAD WITHOUT CONTRAST MRA HEAD WITHOUT CONTRAST TECHNIQUE: Multiplanar, multiecho pulse sequences of the brain and surrounding structures were obtained without intravenous contrast. Angiographic images of the head were obtained using MRA technique without contrast. COMPARISON:  [facility] noncontrast head CTs 01/13/2016 and earlier FINDINGS: MRI HEAD FINDINGS Brain: There are scattered mostly small foci of restricted diffusion in the bilateral deep gray matter nuclei (including both the right basal ganglia and right thalamus), in both occipital poles, occasionally in both frontal lobes and parietal lobes, and more confluently affecting both cerebellar hemispheres, worse on the left. Left cerebellar restricted diffusion encompasses 4-5 cm. There is associated T2 and FLAIR hyperintensity in those areas, but there is profound abnormal T2 and FLAIR hyperintensity throughout the bilateral pons and medulla which corresponds to facilitated diffusion on ADC. Furthermore, the cerebral hemispheres do not demonstrate additional gray and white matter T2 and FLAIR hyperintensity outside of the restricted areas. Also, there is no evidence of associated acute intracranial hemorrhage. Subsequently there is some effacement of the pre pontine and pre medullary cisterns. There is mild mass effect on the fourth ventricle but no ventriculomegaly. No midline shift. No impending herniation. No underlying cortical encephalomalacia or chronic cerebral blood products. Negative pituitary. No extra-axial collection. Mesial temporal lobe structures appear normal. Vascular: Major intracranial  vascular flow voids are preserved. Skull and upper cervical spine: Grossly negative. Sinuses/Orbits: Negative. Other: Visible internal auditory structures appear normal. Mastoids are clear. Negative scalp soft tissues. MRA HEAD FINDINGS Antegrade flow in both distal vertebral arteries. No distal vertebral stenosis.  Both PICA origins are patent. Patent vertebrobasilar junction. No basilar stenosis. SCA and PCA origins are patent. Posterior communicating arteries are diminutive or absent. Bilateral PCA branches are perhaps mildly irregular but otherwise normal. Antegrade flow in both ICA siphons. No siphon stenosis. Both carotid termini are patent. There is mild right ACA A1 segment irregularity and stenosis. Anterior communicating artery and visualized bilateral ACA branches are within normal limits. Both MCA M1 segments are patent. Both MCA bifurcations are patent. Visualized bilateral MCA branches are within normal limits. IMPRESSION: 1. Extensive edema affecting the bilateral pons and medulla with associated brainstem swelling. No associated hemorrhage. 2. 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 No associated hemorrhage or mass effect. 3. Intracranial MRA is negative for emergent large vessel occlusion. There is at most mild irregularity of the bilateral PCAs and the right ACA. 4. No superimposed non infarct related hemispheric gray or white matter signal abnormality. 5. This constellation is unusual. Perhaps this is usual appearance of Severe PRES (posterior reversible encephalopathy syndrome) given the clinical presentation with hypertension and seizure. Otherwise consider an osmotic demyelination or severe metabolic encephalopathy with superimposed acute embolic infarcts. Electronically Signed   By: Genevie Ann M.D.   On: 02/12/2016 16:47   US Renal Port  Result Date:  02/12/2016 CLINICAL DATA:  Thrombocytopenia.  Hypertension. EXAM: RENAL / URINARY TRACT ULTRASOUND COMPLETE COMPARISON:  None. FINDINGS: Right Kidney: Length: 10.1 cm. Echogenicity within normal limits. No mass or hydronephrosis visualized. Left Kidney: Length: 10.1 cm. Echogenicity within normal limits. No mass or hydronephrosis visualized. Bladder: Decompressed around a Foley catheter IMPRESSION: No significant abnormality. Electronically Signed   By: Andreas Newport M.D.   On: 02/12/2016 22:25   [place] Core Biopsy  Result Date: 02/13/2016 INDICATION: Patient admitted with multi system organ failure found to have a fungating mass within the medial aspect the left breast. Request made for ultrasound-guided breast biopsy for tissue diagnostic purposes. EXAM: ULTRASOUND GUIDED BREAST BIOPSY COMPARISON:  None. MEDICATIONS: None ANESTHESIA/SEDATION: None, the patient is currently intubated and sedated in the ICU. COMPLICATIONS: None immediate. PROCEDURE: Informed written consent was obtained from the patient's family after a discussion of the risks, benefits and alternatives to treatment. The patient understands and consents the procedure. A timeout was performed prior to the initiation of the procedure. Note, this procedure was performed in the medical ICU given patient's medical fragility. Ultrasound scanning was performed of the palpable area of concern involving the medial aspect of the left breast demonstrating a large (at least 2.8 x 2.3 cm) hypoechoic solid pass within the 9 o'clock position of the left breast, approximately 5 cm from the nipple. The procedure was planned. The medial aspect the left breast was prepped and draped in the usual sterile fashion. The overlying soft tissues were anesthetized with 1% lidocaine with epinephrine. An 18 gauge core needle biopsy device was a utilized to obtain 6 core needle biopsies. Multiple ultrasound images were saved for procedural documentation purposes. Superficial  hemostasis was obtained with manual compression. Post procedural scanning was negative for definitive area of hemorrhage. A dressing was placed. The patient tolerated the procedure well without immediate post procedural complication. IMPRESSION: Technically successful ultrasound guided of palpable mass within medial aspect of the left breast. Note, a biopsy clip was not left in place as this procedure was performed at the bedside in the ICU to help aid the patient's acute management. Would recommend a  formal diagnostic workup following the resolution of the patient's acute medical comorbidities. Electronically Signed   By: Sandi Mariscal M.D.   On: 02/13/2016 17:00   Dg Chest Port 1 View  Result Date: 02/14/2016 CLINICAL DATA:  Hypoxia EXAM: PORTABLE CHEST 1 VIEW COMPARISON:  February 12, 2016 FINDINGS: Endotracheal tube tip is 5.6 cm above the carina. Nasogastric tube tip is at the gastroesophageal junction with the side port above the gastroesophageal junction. No pneumothorax. There is mild right base atelectasis. Lungs elsewhere clear. Heart size and pulmonary vascularity are normal. No adenopathy. No bone lesions. IMPRESSION: Tube positions as described without pneumothorax. Note that the nasogastric tube tip is at the gastroesophageal junction. Advise advancing nasogastric tube approximately 10 cm to insure that the nasogastric tube tip and side port are well within the stomach. There is right base atelectasis. Lungs elsewhere clear. Stable cardiac silhouette. These results will be called to the ordering clinician or representative by the Radiologist Assistant, and communication documented in the PACS or zVision Dashboard. Electronically Signed   By: Lowella Grip III M.D.   On: 02/14/2016 07:06    Labs: BMET  Recent Labs Lab 02/03/2016 1711 02/12/16 0444 02/13/16 0215 02/13/16 1711 02/14/16 0238  NA 138 138 138 140 139  K 4.1 4.8 5.2* 5.3* 5.3*  CL 104 107 105 110 107  CO2 20* 18* 18* 15*  16*  GLUCOSE 136* 115* 131* 112* 108*  BUN 56* 64* 85* 103* 110*  CREATININE 6.47* 7.13* 8.60* 9.54* 9.56*  CALCIUM 8.3* 8.4* 8.5* 8.2* 8.8*  PHOS 6.7* 6.9* 8.6*  --  10.6*   CBC  Recent Labs Lab 02/08/2016 1711 02/12/16 0444 02/13/16 0215 02/14/16 0238  WBC 16.8* 13.5* 11.2* 10.7*  NEUTROABS 15.3*  --  10.0* 9.6*  HGB 8.2* 7.7* 7.6* 7.3*  HCT 24.7* 22.9* 22.4* 22.1*  MCV 88.8 90.2 88.5 88.4  PLT 133*  130* 138* 198 238    Medications:    . chlorhexidine  15 mL Mouth Rinse BID  . famotidine (PEPCID) IV  20 mg Intravenous Q24H  . mouth rinse  15 mL Mouth Rinse 10 times per day  . OXcarbazepine  900 mg Per Tube BID  . piperacillin-tazobactam (ZOSYN)  IV  2.25 g Intravenous Q6H   Elmarie Shiley, MD 02/14/2016, 7:37 AM

## 2016-02-14 NOTE — Care Management Note (Signed)
Case Management Note  Patient Details  Name: Samantha Pittman MRN: [id number] Date of Birth: Apr 10, 1976  Subjective/Objective:     Pt admitted s/p CVA               Action/Plan:  Pt found unresponsive .  ATN worse - multifactorial (K 5.3 and bic 16) - will d/w renal about HD - sepsis du eto strep - abx as below -PRES syndrome - remains comatose - Neuro planning MRI in 1 weeks from 02/24/16 - Acute encephalopathy coma - due to above - Breast mass - await bx from 02/13/16 - Will likel need trach given anticipoated long course.  CM will continue to follow for discharge needs    Expected Discharge Date:                  Expected Discharge Plan:     In-House Referral:     Discharge planning Services  CM Consult  Post Acute Care Choice:    Choice offered to:     DME Arranged:    DME Agency:     HH Arranged:    HH Agency:     Status of Service:  In process, will continue to follow  If discussed at Long Length of Stay Meetings, dates discussed:    Additional Comments:  Maryclare Labrador, RN 02/14/2016, 3:47 PM

## 2016-02-14 NOTE — Progress Notes (Signed)
STROKE TEAM PROGRESS NOTE   HISTORY OF PRESENT ILLNESS (per record) Ocie Kalb is an 40 yo female with hx of seizures and HTN presented to outside hospital with hypertensive urgency. BP was lowered quickly and had a seizure. 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 She was intubated for airway protection and transferred to [facility] for further evaluation.   Patient was not administered IV t-PA. She was admitted to the neuro ICU for further evaluation and treatment.   SUBJECTIVE (INTERVAL HISTORY) Patient remains intubated and obtunded and unresponsive. Blood pressure remains well controlled. She has had no further seizures..Afebrile but blood cultures 1/2 positive for Strep . Now on zosyn. Renal function worse OBJECTIVE Temp:  [97.7 F (36.5 C)-99.8 F (37.7 C)] 98.8 F (37.1 C) (11/03 1139) Pulse Rate:  [94-122] 115 (11/03 1400) Cardiac Rhythm: Sinus tachycardia (11/03 1200) Resp:  [14-34] 17 (11/03 1400) BP: (120-165)/(72-112) 120/79 (11/03 1400) SpO2:  [97 %-100 %] 100 % (11/03 1400) FiO2 (%):  [40 %] 40 % (11/03 1200) Weight:  [167 lb 14.4 oz (76.2 kg)] 167 lb 14.4 oz (76.2 kg) (11/03 0434)  CBC:   Recent Labs Lab 02/13/16 0215 02/14/16 0238  WBC 11.2* 10.7*  NEUTROABS 10.0* 9.6*  HGB 7.6* 7.3*  HCT 22.4* 22.1*  MCV 88.5 88.4  PLT 198 [id number]    Basic Metabolic Panel:   Recent Labs Lab 02/13/16 0215 02/13/16 1711 02/14/16 0238  NA 138 140 139  K 5.2* 5.3* 5.3*  CL 105 110 107  CO2 18* 15* 16*  GLUCOSE 131* 112* 108*  BUN 85* 103* 110*  CREATININE 8.60* 9.54* 9.56*  CALCIUM 8.5* 8.2* 8.8*  MG 2.3  --  2.6*  PHOS 8.6*  --  10.6*    Lipid Panel:     Component Value Date/Time   CHOL 183 02/13/2016 0215   TRIG 83 02/13/2016 0215   HDL 66 02/13/2016 0215   CHOLHDL 2.8 02/13/2016 0215   VLDL 17 02/13/2016 0215   LDLCALC 100 (H) 02/13/2016 0215   HgbA1c:  Lab  Results  Component Value Date   HGBA1C 4.7 (L) 02/13/2016   Urine Drug Screen:     Component Value Date/Time   LABOPIA NONE DETECTED 02/12/2016 1319   COCAINSCRNUR NONE DETECTED 02/12/2016 1319   LABBENZ NONE DETECTED 02/12/2016 1319   AMPHETMU NONE DETECTED 02/12/2016 1319   THCU NONE DETECTED 02/12/2016 1319   LABBARB NONE DETECTED 02/12/2016 1319      IMAGING  Mr Jodene Nam Head Wo Contrast  Result Date: 02/12/2016 CLINICAL DATA:  40 year old female with personal history of hypertension, originally presenting with dizziness and then became unresponsive and had seizure activity. Abnormal head CT. Transferred to the ICU. Initial encounter. EXAM: MRI HEAD WITHOUT CONTRAST MRA HEAD WITHOUT CONTRAST TECHNIQUE: Multiplanar, multiecho pulse sequences of the brain and surrounding structures were obtained without intravenous contrast. Angiographic images of the head were obtained using MRA technique without contrast. COMPARISON:  [facility] noncontrast head CTs 01/31/2016 and earlier FINDINGS: MRI HEAD FINDINGS Brain: There are scattered mostly small foci of restricted diffusion in the bilateral deep gray matter nuclei (including both the right basal ganglia and right thalamus), in both occipital poles, occasionally in both frontal lobes and parietal lobes, and more confluently affecting both cerebellar hemispheres, worse on the left. Left cerebellar restricted diffusion encompasses 4-5 cm. There is associated T2 and FLAIR hyperintensity in those  areas, but there is profound abnormal T2 and FLAIR hyperintensity throughout the bilateral pons and medulla which corresponds to facilitated diffusion on ADC. Furthermore, the cerebral hemispheres do not demonstrate additional gray and white matter T2 and FLAIR hyperintensity outside of the restricted areas. Also, there is no evidence of associated acute intracranial hemorrhage. Subsequently there is some effacement of the pre pontine and pre  medullary cisterns. There is mild mass effect on the fourth ventricle but no ventriculomegaly. No midline shift. No impending herniation. No underlying cortical encephalomalacia or chronic cerebral blood products. Negative pituitary. No extra-axial collection. Mesial temporal lobe structures appear normal. Vascular: Major intracranial vascular flow voids are preserved. Skull and upper cervical spine: Grossly negative. Sinuses/Orbits: Negative. Other: Visible internal auditory structures appear normal. Mastoids are clear. Negative scalp soft tissues. MRA HEAD FINDINGS Antegrade flow in both distal vertebral arteries. No distal vertebral stenosis. Both PICA origins are patent. Patent vertebrobasilar junction. No basilar stenosis. SCA and PCA origins are patent. Posterior communicating arteries are diminutive or absent. Bilateral PCA branches are perhaps mildly irregular but otherwise normal. Antegrade flow in both ICA siphons. No siphon stenosis. Both carotid termini are patent. There is mild right ACA A1 segment irregularity and stenosis. Anterior communicating artery and visualized bilateral ACA branches are within normal limits. Both MCA M1 segments are patent. Both MCA bifurcations are patent. Visualized bilateral MCA branches are within normal limits. IMPRESSION: 1. Extensive edema affecting the bilateral pons and medulla with associated brainstem swelling. No associated hemorrhage. 2. 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 No associated hemorrhage or mass effect. 3. Intracranial MRA is negative for emergent large vessel occlusion. There is at most mild irregularity of the bilateral PCAs and the right ACA. 4. No superimposed non infarct related hemispheric gray or white matter signal abnormality. 5. This constellation is unusual. Perhaps this is usual appearance of Severe PRES  (posterior reversible encephalopathy syndrome) given the clinical presentation with hypertension and seizure. Otherwise consider an osmotic demyelination or severe metabolic encephalopathy with superimposed acute embolic infarcts. Electronically Signed   By: Genevie Ann M.D.   On: 02/12/2016 16:47   Mr Brain Wo Contrast  Result Date: 02/12/2016 CLINICAL DATA:  40 year old female with personal history of hypertension, originally presenting with dizziness and then became unresponsive and had seizure activity. Abnormal head CT. Transferred to the ICU. Initial encounter. EXAM: MRI HEAD WITHOUT CONTRAST MRA HEAD WITHOUT CONTRAST TECHNIQUE: Multiplanar, multiecho pulse sequences of the brain and surrounding structures were obtained without intravenous contrast. Angiographic images of the head were obtained using MRA technique without contrast. COMPARISON:  [facility] noncontrast head CTs 01/17/2016 and earlier FINDINGS: MRI HEAD FINDINGS Brain: There are scattered mostly small foci of restricted diffusion in the bilateral deep gray matter nuclei (including both the right basal ganglia and right thalamus), in both occipital poles, occasionally in both frontal lobes and parietal lobes, and more confluently affecting both cerebellar hemispheres, worse on the left. Left cerebellar restricted diffusion encompasses 4-5 cm. There is associated T2 and FLAIR hyperintensity in those areas, but there is profound abnormal T2 and FLAIR hyperintensity throughout the bilateral pons and medulla which corresponds to facilitated diffusion on ADC. Furthermore, the cerebral hemispheres do not demonstrate additional gray and white matter T2 and FLAIR hyperintensity outside of the restricted areas. Also, there is no evidence of associated acute intracranial hemorrhage. Subsequently there is some effacement of the pre pontine and  pre medullary cisterns. There is mild mass effect on the fourth ventricle but no ventriculomegaly.  No midline shift. No impending herniation. No underlying cortical encephalomalacia or chronic cerebral blood products. Negative pituitary. No extra-axial collection. Mesial temporal lobe structures appear normal. Vascular: Major intracranial vascular flow voids are preserved. Skull and upper cervical spine: Grossly negative. Sinuses/Orbits: Negative. Other: Visible internal auditory structures appear normal. Mastoids are clear. Negative scalp soft tissues. MRA HEAD FINDINGS Antegrade flow in both distal vertebral arteries. No distal vertebral stenosis. Both PICA origins are patent. Patent vertebrobasilar junction. No basilar stenosis. SCA and PCA origins are patent. Posterior communicating arteries are diminutive or absent. Bilateral PCA branches are perhaps mildly irregular but otherwise normal. Antegrade flow in both ICA siphons. No siphon stenosis. Both carotid termini are patent. There is mild right ACA A1 segment irregularity and stenosis. Anterior communicating artery and visualized bilateral ACA branches are within normal limits. Both MCA M1 segments are patent. Both MCA bifurcations are patent. Visualized bilateral MCA branches are within normal limits. IMPRESSION: 1. Extensive edema affecting the bilateral pons and medulla with associated brainstem swelling. No associated hemorrhage. 2. 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 No associated hemorrhage or mass effect. 3. Intracranial MRA is negative for emergent large vessel occlusion. There is at most mild irregularity of the bilateral PCAs and the right ACA. 4. No superimposed non infarct related hemispheric gray or white matter signal abnormality. 5. This constellation is unusual. Perhaps this is usual appearance of Severe PRES (posterior reversible encephalopathy syndrome) given the clinical presentation with hypertension  and seizure. Otherwise consider an osmotic demyelination or severe metabolic encephalopathy with superimposed acute embolic infarcts. Electronically Signed   By: Genevie Ann M.D.   On: 02/12/2016 16:47   US Renal Port  Result Date: 02/12/2016 CLINICAL DATA:  Thrombocytopenia.  Hypertension. EXAM: RENAL / URINARY TRACT ULTRASOUND COMPLETE COMPARISON:  None. FINDINGS: Right Kidney: Length: 10.1 cm. Echogenicity within normal limits. No mass or hydronephrosis visualized. Left Kidney: Length: 10.1 cm. Echogenicity within normal limits. No mass or hydronephrosis visualized. Bladder: Decompressed around a Foley catheter IMPRESSION: No significant abnormality. Electronically Signed   By: Andreas Newport M.D.   On: 02/12/2016 22:25   [place] Core Biopsy  Result Date: 02/13/2016 INDICATION: Patient admitted with multi system organ failure found to have a fungating mass within the medial aspect the left breast. Request made for ultrasound-guided breast biopsy for tissue diagnostic purposes. EXAM: ULTRASOUND GUIDED BREAST BIOPSY COMPARISON:  None. MEDICATIONS: None ANESTHESIA/SEDATION: None, the patient is currently intubated and sedated in the ICU. COMPLICATIONS: None immediate. PROCEDURE: Informed written consent was obtained from the patient's family after a discussion of the risks, benefits and alternatives to treatment. The patient understands and consents the procedure. A timeout was performed prior to the initiation of the procedure. Note, this procedure was performed in the medical ICU given patient's medical fragility. Ultrasound scanning was performed of the palpable area of concern involving the medial aspect of the left breast demonstrating a large (at least 2.8 x 2.3 cm) hypoechoic solid pass within the 9 o'clock position of the left breast, approximately 5 cm from the nipple. The procedure was planned. The medial aspect the left breast was prepped and draped in the usual sterile fashion. The overlying soft  tissues were anesthetized with 1% lidocaine with epinephrine. An 18 gauge core needle biopsy device was a utilized to obtain 6 core needle  biopsies. Multiple ultrasound images were saved for procedural documentation purposes. Superficial hemostasis was obtained with manual compression. Post procedural scanning was negative for definitive area of hemorrhage. A dressing was placed. The patient tolerated the procedure well without immediate post procedural complication. IMPRESSION: Technically successful ultrasound guided of palpable mass within medial aspect of the left breast. Note, a biopsy clip was not left in place as this procedure was performed at the bedside in the ICU to help aid the patient's acute management. Would recommend a formal diagnostic workup following the resolution of the patient's acute medical comorbidities. Electronically Signed   By: Sandi Mariscal M.D.   On: 02/13/2016 17:00   Dg Chest Port 1 View  Result Date: 02/14/2016 CLINICAL DATA:  Hypoxia EXAM: PORTABLE CHEST 1 VIEW COMPARISON:  February 12, 2016 FINDINGS: Endotracheal tube tip is 5.6 cm above the carina. Nasogastric tube tip is at the gastroesophageal junction with the side port above the gastroesophageal junction. No pneumothorax. There is mild right base atelectasis. Lungs elsewhere clear. Heart size and pulmonary vascularity are normal. No adenopathy. No bone lesions. IMPRESSION: Tube positions as described without pneumothorax. Note that the nasogastric tube tip is at the gastroesophageal junction. Advise advancing nasogastric tube approximately 10 cm to insure that the nasogastric tube tip and side port are well within the stomach. There is right base atelectasis. Lungs elsewhere clear. Stable cardiac silhouette. These results will be called to the ordering clinician or representative by the Radiologist Assistant, and communication documented in the PACS or zVision Dashboard. Electronically Signed   By: Lowella Grip III M.D.    On: 02/14/2016 07:06   EEG  This EEG is abnormal with mild generalized nonspecific continuous slowing of cerebral activity which can be seen with sedating medications as well as metabolic and toxic encephalopathies. No evidence of epileptiform activity was recorded.   PHYSICAL EXAM middle aged african [place] lady who is intubated. Not on sedation. . Afebrile. Head is nontraumatic. Neck is supple without bruit.    Cardiac exam no murmur or gallop. Lungs are clear to auscultation. Distal pulses are well felt. Neurological Exam : Intubated. Comatose. Eyes are closed. Not following any commands. Right pupil is irregular not reactive. Left pupil 2 mm sluggishly reactive. Corneal reflexes are present bilaterally. Fundi could not be visualized. Doll's eye movements are sluggish. Patient has a cough and gag She does not grimace to facial pain. No spontaneous extremity movements. Patient will withdraw both lower extremities greater than upper extremity distant painful stimuli. No posturing. Deep tendon reflexes are 1+ symmetric. Plantars are downgoing. ASSESSMENT/PLAN Ms. Marshawna Lippitt is a 40 y.o. female with history of HTN and seizures presenting to outlying hospital with hypertensive emergency followed by seizure. She did not present with stroke symptoms. CT shows a stroke, age indeterminate.    Comatose state with poor neurological exam secondary to extensive brainstem edema and white matter lesions consistent with hypertensive encephalopathy. Superimposed acute infarcts in both cerebral hemispheres etiology unclear whether related to breast or other. Relatively sudden presentation following a GI illness with now fever and elevated white count raise concern for sepsis. Etiology of renal failure likely related to hypovolemia and acute tubular necrosis   Other stroke  CT at outlying hospital with R caudate lacunar infarct MRI/MRA 1. Extensive edema affecting the bilateral pons and medulla  with associated brainstem swelling. No associated hemorrhage. 2. Superimposed scattered acute infarcts both cerebral hemispheres affecting anterior and posterior vascular territory use, and also affecting the cerebellum greater on the  left. No associated hemorrhage or mass effect. 3. Intracranial MRA is negative for emergent large vessel occlusion. There is at most mild irregularity of the bilateral PCAs and the  right ACA.  Carotid Doppler  Could not obtain images from right carotid due to poor patient position with head turned completely to the right. Tried to reposition patient multiple times but she kept head firmly to the right.  Left :No significant (1-39%) ICA stenosis. Antegrade vertebral flow.  2D Echo  Left ventricle:  The cavity size was normal. There was moderate concentric hypertrophy. Systolic function was vigorous. The estimated ejection fraction was in the range of 65% to 70%. Wall motion was normal; there were no regional wall motion  abnormalitiesLDL  100  HgbA1c 4.7  SCDs for VTE prophylaxis Diet NPO time specified  No antithrombotic prior to admission, received one dose of aspirin 324 mg yesterday. No further aspirin given likely TTP  Ongoing aggressive stroke risk factor management  Therapy recommendations:  Will add once stable  Disposition:  pending   Seizure   Not related to stroke seen on CT  EEG generalized nonspecific slowing  Continue trileptal 300 bid  Continuous EEG  Respiratory failure  Intubated after seizure for airway protection  CCM attending  Hypertensive Emergency  Not on home medications  Stable this am Long-term BP goal normotensive  Other Active Problems  Acute kidney injury, Cr 6.47 possibly ATN  Unlikely TTP, Thrombocytopenia 130. Considering plasma exchange  Mild anemia 8.2  Ulcerating L breast mass  Sepsis  Hospital day # 3 I have personally examined this patient, reviewed notes, independently viewed  imaging studies, participated in medical decision making and plan of care.ROS completed by me personally and pertinent positives fully documented  I have made any additions or clarifications directly to the above note. Plan to repeat MRI brain in 1 week to look for evolution of PRES changes. Continue antibiotics for sepsis. Maintain adequate intravascular volume. Breast lesion biopsy results pending. Discussed with Dr. Chase Caller,  This patient is critically ill and at significant risk of neurological worsening, death and care requires constant monitoring of vital signs, hemodynamics,respiratory and cardiac monitoring, extensive review of multiple databases, frequent neurological assessment, discussion with family, other specialists and medical decision making of high complexity.I have made any additions or clarifications directly to the above note.This critical care time does not reflect procedure time, or teaching time or supervisory time of PA/NP/Med Resident etc but could involve care discussion time.  I spent 35 minutes of neurocritical care time  in the care of  this patient.      Antony Contras, MD Medical Director [facility] Pager: [phone] 02/14/2016 3:01 PM     To contact Stroke Continuity provider, please refer to [email or web address] After hours, contact General Neurology

## 2016-02-14 NOTE — Procedures (Signed)
Hemodialysis Catheter Insertion Procedure Note Jovey Kantola SN:[id number] 02-01-1976  Procedure: Insertion of Hemodialysis Catheter Indications: Hemodialysis  Procedure Details Consent: Risks of procedure as well as the alternatives and risks of each were explained to the (patient/caregiver).  Consent for procedure obtained.  Time Out: Verified patient identification, verified procedure, site/side was marked, verified correct patient position, special equipment/implants available, medications/allergies/relevent history reviewed, required imaging and test results available.  Performed  Maximum sterile technique was used including antiseptics, cap, gloves, gown, hand hygiene, mask and sheet.  Skin prep: Chlorhexidine; local anesthetic administered  A Trialysis HD catheter was placed in the right internal jugular vein using the Seldinger technique.  Evaluation Blood flow good Complications: No apparent complications Patient did tolerate procedure well. Chest X-ray ordered to verify placement.  CXR: pending.   Procedure performed under direct supervision of Dr. Chase Caller and with ultrasound guidance for real time vessel cannulation.     Hayden Pedro, AG-ACNP [facility]  Pgr: [phone]  PCCM Pgr: [phone]

## 2016-02-14 NOTE — Progress Notes (Signed)
*  PRELIMINARY RESULTS* Vascular Ultrasound Transcranial Doppler has been completed.  02/14/2016 4:10 PM Maudry Mayhew, BS, RVT, RDCS, RDMS

## 2016-02-14 NOTE — Progress Notes (Signed)
PULMONARY / CRITICAL CARE MEDICINE   Name: Samantha Pittman MRN: [id number] DOB: 07/29/1975    ADMISSION DATE:  01/29/2016 CONSULTATION DATE:  01/16/2016  REFERRING MD:  Dr, James Parsons / [facility]   CHIEF COMPLAINT:  Weakness / Dizziness   BRIEF:  40 y/o F with PMH of tubal ligation, HTN,seizures who presented to [facility] on 10/30 with a 6 day history of weakness, dizziness and difficulty seeing out of her left eye.    Initial evaluation found the patient to be significantly hypertensive with presenting pressure of 224/160 (MAP 81).  Initial labs Na 135, K 3.6, Cl 93, AG 24, BUN 36, sr cr 2.94, glucose 184, mag 2.5, AST 31/ALT 14, alk phos 136, TSH 0.37, WBC 11.3, Hgb 11, and platelets 51.  UA was negative.  She was treated with captopril, ativan, amlodipine and labetalol to reduce blood pressure.  The patient was admitted to [facility] for further evaluation.  She later had a seizure.  Per report, she was felt to be post ictal while on the medical floor.  Am of 10/31, she remained altered and pupils were unequal.  Follow up CT of the head was obtained which demonstrated an abnormal new hypodensity in the head of the right caudate nucleus extending into the right lentiform nucleus, acute or early subacute infarct in this vicinity.  The patient was intubated and transferred to [facility] for further evaluation.    STUDIES:  EEG 10/31:  Mild generalized nonspecific continuous slowing of cerebral activity which can be seen with sedating medications as well as metabolic and toxic encephalopathies. No evidence of epileptiform activity was recorded. MRI/MRA Head 11/1 >> Extensive edema affecting bilateral pons and medulla with associated brainstem swelling. No associated hemorrhage. Superimposed scattered acute infarcts both cerebral hemispheres.  US Left Breast 11/1 >>  Renal Ultrasound: No significant abnormality   MICROBIOLOGY: MRSA PCR 10/31:  Negative Blood Cx 11/1:  NGTD  Breast Wound Culture: Pending   ANTIBIOTICS: None   LINES/TUBES: OETT 10/31 >>  OGT 10/31 >> FOLEY 10/31 >> PIV x2  SIGNIFICANT EVENTS: 10/30 - Admit to [facility] with weakness, dizziness.  Hypertensive emergency.  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  11/2: Sepsis protocol initiated for fevers, elevated WBC, and elevated procalcitonin. ECHO with EF 65-70% and mildly increased pulmonary artery systolic pressure.   SUBJECTIVE:   Worsening of Cr and Oliguria. Lasix given. Still very obtunded. Per RN, only grimaces with oral care otherwise not very responsive.    VITAL SIGNS: BP (!) 140/91 (BP Location: Right Arm)   Pulse (!) 104   Temp 98 F (36.7 C) (Oral)   Resp 16   Wt 167 lb 14.4 oz (76.2 kg)   LMP  (LMP Unknown)   SpO2 100%   BMI 27.10 kg/m   HEMODYNAMICS:    VENTILATOR SETTINGS: Vent Mode: PRVC FiO2 (%):  [40 %] 40 % Set Rate:  [12 bmp] 12 bmp Vt Set:  [400 mL] 400 mL PEEP:  [5 cmH20] 5 cmH20 Pressure Support:  [8 cmH20] 8 cmH20  INTAKE / OUTPUT: I/O last 3 completed shifts: In: 2700 [I.V.:300; IV Piggyback:2400] Out: 2045 [Urine:2045]  PHYSICAL EXAMINATION: General:  No acute distress. Sedated.  Integument:  Warm & dry. No rash on exposed skin. No bruising. HEENT:  No scleral injection or icterus. Pupils reactive but sluggish. Endotracheal tube in place.    Cardiovascular:  Regular rate. No edema. No appreciable JVD.  Pulmonary:  Clear bilaterally to auscultation. Symmetric chest wall rise on ventilator. Abdomen: Soft. Normal bowel sounds. Nondistended.  Musculoskeletal:  No joint deformity or effusion appreciated. Neurological:  No withdrawal to pain in extremities. No spontaneous movements. No response to voice.   LABS:  BMET  Recent Labs Lab 02/13/16 0215 02/13/16 1711 02/14/16 0238  NA  138 140 139  K 5.2* 5.3* 5.3*  CL 105 110 107  CO2 18* 15* 16*  BUN 85* 103* 110*  CREATININE 8.60* 9.54* 9.56*  GLUCOSE 131* 112* 108*    Electrolytes  Recent Labs Lab 02/12/16 0444 02/13/16 0215 02/13/16 1711 02/14/16 0238  CALCIUM 8.4* 8.5* 8.2* 8.8*  MG 2.3 2.3  --  2.6*  PHOS 6.9* 8.6*  --  10.6*    CBC  Recent Labs Lab 02/12/16 0444 02/13/16 0215 02/14/16 0238  WBC 13.5* 11.2* 10.7*  HGB 7.7* 7.6* 7.3*  HCT 22.9* 22.4* 22.1*  PLT 138* 198 238    Coag's  Recent Labs Lab 01/28/2016 1711 02/13/16 0215 02/14/16 0238  APTT 31 31 35  INR 1.17  1.22 1.38 1.27    Sepsis Markers  Recent Labs Lab 02/12/16 1825 02/13/16 0215 02/13/16 1711 02/14/16 0238  LATICACIDVEN  --   --  1.0  --   PROCALCITON 0.73 8.77  --  18.68    ABG  Recent Labs Lab 01/15/2016 1625 02/13/16 1955  PHART 7.392 7.323*  PCO2ART 29.6* 32.1  PO2ART 169* 83.0    Liver Enzymes  Recent Labs Lab 01/21/2016 1711 02/13/16 0215 02/13/16 1711 02/14/16 0238  AST 29  --  210*  --   ALT 18  --  271*  --   ALKPHOS 100  --  115  --   BILITOT 0.4  --  0.6  --   ALBUMIN 3.1* 2.5* 2.4* 2.5*    Cardiac Enzymes  Recent Labs Lab 02/12/16 1034 02/12/16 1701 02/12/16 2215  TROPONINI 1.97* 1.35* 2.23*    Glucose  Recent Labs Lab 02/12/16 1143  GLUCAP 114*    Imaging Us Core Biopsy  Result Date: 02/13/2016 INDICATION: Patient admitted with multi system organ failure found to have a fungating mass within the medial aspect the left breast. Request made for ultrasound-guided breast biopsy for tissue diagnostic purposes. EXAM: ULTRASOUND GUIDED BREAST BIOPSY COMPARISON:  None. MEDICATIONS: None ANESTHESIA/SEDATION: None, the patient is currently intubated and sedated in the ICU. COMPLICATIONS: None immediate. PROCEDURE: Informed written consent was obtained from the patient's family after a discussion of the risks, benefits and alternatives to treatment. The patient understands  and consents the procedure. A timeout was performed prior to the initiation of the procedure. Note, this procedure was performed in the medical ICU given patient's medical fragility. Ultrasound scanning was performed of the palpable area of concern involving the medial aspect of the left breast demonstrating a large (at least 2.8 x 2.3 cm) hypoechoic solid pass within the 9 o'clock position of the left breast, approximately 5 cm from the nipple. The procedure was planned. The medial aspect the left breast was prepped and draped in the usual sterile fashion. The overlying soft tissues were anesthetized with 1% lidocaine with epinephrine. An 18 gauge core needle biopsy device was a utilized to obtain 6 core needle biopsies. Multiple ultrasound images were saved for procedural documentation purposes. Superficial hemostasis was obtained with manual compression. Post procedural scanning was negative for definitive area of hemorrhage. A dressing was placed. The   patient tolerated the procedure well without immediate post procedural complication. IMPRESSION: Technically successful ultrasound guided of palpable mass within medial aspect of the left breast. Note, a biopsy clip was not left in place as this procedure was performed at the bedside in the ICU to help aid the patient's acute management. Would recommend a formal diagnostic workup following the resolution of the patient's acute medical comorbidities. Electronically Signed   By: Sandi Mariscal M.D.   On: 02/13/2016 17:00   Dg Chest Port 1 View  Result Date: 02/14/2016 CLINICAL DATA:  Hypoxia EXAM: PORTABLE CHEST 1 VIEW COMPARISON:  February 12, 2016 FINDINGS: Endotracheal tube tip is 5.6 cm above the carina. Nasogastric tube tip is at the gastroesophageal junction with the side port above the gastroesophageal junction. No pneumothorax. There is mild right base atelectasis. Lungs elsewhere clear. Heart size and pulmonary vascularity are normal. No adenopathy. No bone  lesions. IMPRESSION: Tube positions as described without pneumothorax. Note that the nasogastric tube tip is at the gastroesophageal junction. Advise advancing nasogastric tube approximately 10 cm to insure that the nasogastric tube tip and side port are well within the stomach. There is right base atelectasis. Lungs elsewhere clear. Stable cardiac silhouette. These results will be called to the ordering clinician or representative by the Radiologist Assistant, and communication documented in the PACS or zVision Dashboard. Electronically Signed   By: Lowella Grip III M.D.   On: 02/14/2016 07:06    ASSESSMENT / PLAN:  NEUROLOGIC A:   Acute Encephalopathy - Unclear etiology. CVA vs PRES vs TTP. 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 MRI with extensive edema without hemorrhage. Superimposed scattered acute infarcts.  Seizure - Occurred w/ correction of hypertensive urgency/emergency. H/O Seizure Disorder  P:   RASS goal: 0  Neurology Consulted & following Seizure Precautions AEDs per Neurology:  Trileptal Versed IV prn Seizure & Sedation Fentanyl IV prn Pain/Discomfort   CARDIOVASCULAR A:  Hypertensive Emergency - Initially.  Hypertension  Elevated Troponin I - Likely subendocardial ischemia. H/O HTN - No medications as an outpatient.  P:  Continuous Telemetry Monitoring Vitals per Unit Protocol Goal SBP >65 Hydralazine prn    RENAL A:   Acute Renal Failure - Most likely ischemic ATN.  Metabolic Acidosis - Mild. Hyperphosphatemia - Likely due to acute renal failure.  P:   Trending UOP Monitoring renal function & electrolytes daily Nephrology following, give another dose of lasix and start Na Bicarb  Replacing electrolytes as indicated  HEMATOLOGIC/ONCOLOGIC A:   Anemia - No signs of active bleeding. Thrombocytopenia - Mild and improving. Schistocytes on smear. Possible TTP with altered mental status. Left Breast Mass s/p US guided biopsy   P:  Trending  Cell Counts daily w/ CBC Trending Coags daily SCDs Hematology/Oncology following  Breast pathology pending    PULMONARY A: Acute Respiratory Failure - Unable to protect airway with altered mental status.   P:   Full Vent Support Intermittent ABG & Portable CXR Weaning FiO2 for Sat >92% Albuterol neb prn  GASTROINTESTINAL A:   No acute issues.  P:   NPO Pepcid IV q24hr Holding on Tube feedings for now  INFECTIOUS A:   Concern for sepsis  Blood Cx (11/1): Strep species in 1/2 bottles   P:   Monitoring for signs Follow blood cultures  Vancomycin (11/2 >>) and Zosyn (11/2 >> )   ENDOCRINE A:   No acute issues.   P:   Monitor glucose with daily labs.   FAMILY  -  Updates: No family at bedside    Phill Myron, [place]. 02/14/2016, 7:16 AM PGY-2, [place]

## 2016-02-15 ENCOUNTER — Inpatient Hospital Stay (HOSPITAL_COMMUNITY): Payer: Medicaid Other

## 2016-02-15 LAB — CULTURE, BLOOD (ROUTINE X 2)

## 2016-02-15 LAB — RENAL FUNCTION PANEL
ALBUMIN: 2.5 g/dL — AB (ref 3.5–5.0)
ANION GAP: 20 — AB (ref 5–15)
BUN: 146 mg/dL — AB (ref 6–20)
CHLORIDE: 105 mmol/L (ref 101–111)
CO2: 16 mmol/L — AB (ref 22–32)
Calcium: 8.7 mg/dL — ABNORMAL LOW (ref 8.9–10.3)
Creatinine, Ser: 11.56 mg/dL — ABNORMAL HIGH (ref 0.44–1.00)
GFR calc Af Amer: 4 mL/min — ABNORMAL LOW (ref >60)
GFR calc non Af Amer: 4 mL/min — ABNORMAL LOW (ref >60)
GLUCOSE: 109 mg/dL — AB (ref 65–99)
PHOSPHORUS: 13.2 mg/dL — AB (ref 2.5–4.6)
POTASSIUM: 5.7 mmol/L — AB (ref 3.5–5.1)
Sodium: 141 mmol/L (ref 135–145)

## 2016-02-15 LAB — CBC
HCT: 22.4 % — ABNORMAL LOW (ref 36.0–46.0)
HEMOGLOBIN: 7.4 g/dL — AB (ref 12.0–15.0)
MCH: 29.5 pg (ref 26.0–34.0)
MCHC: 33 g/dL (ref 30.0–36.0)
MCV: 89.2 fL (ref 78.0–100.0)
PLATELETS: 338 10*3/uL (ref 150–400)
RBC: 2.51 MIL/uL — AB (ref 3.87–5.11)
RDW: 16.6 % — ABNORMAL HIGH (ref 11.5–15.5)
WBC: 15.3 10*3/uL — ABNORMAL HIGH (ref 4.0–10.5)

## 2016-02-15 LAB — AEROBIC CULTURE  (SUPERFICIAL SPECIMEN): CULTURE: NORMAL

## 2016-02-15 LAB — AEROBIC CULTURE W GRAM STAIN (SUPERFICIAL SPECIMEN)

## 2016-02-15 LAB — FIBRINOGEN: Fibrinogen: >800 mg/dL — ABNORMAL HIGH (ref 210–475)

## 2016-02-15 LAB — HEPATIC FUNCTION PANEL
ALBUMIN: 2.5 g/dL — AB (ref 3.5–5.0)
ALT: 178 U/L — ABNORMAL HIGH (ref 14–54)
AST: 33 U/L (ref 15–41)
Alkaline Phosphatase: 114 U/L (ref 38–126)
Bilirubin, Direct: 0.3 mg/dL (ref 0.1–0.5)
Indirect Bilirubin: 0.5 mg/dL (ref 0.3–0.9)
TOTAL PROTEIN: 6.8 g/dL (ref 6.5–8.1)
Total Bilirubin: 0.8 mg/dL (ref 0.3–1.2)

## 2016-02-15 LAB — MAGNESIUM
MAGNESIUM: 3 mg/dL — AB (ref 1.7–2.4)
MAGNESIUM: 3.1 mg/dL — AB (ref 1.7–2.4)
Magnesium: 2.9 mg/dL — ABNORMAL HIGH (ref 1.7–2.4)

## 2016-02-15 LAB — CBC WITH DIFFERENTIAL/PLATELET
BASOS ABS: 0 10*3/uL (ref 0.0–0.1)
Basophils Relative: 0 %
Eosinophils Absolute: 0 10*3/uL (ref 0.0–0.7)
Eosinophils Relative: 0 %
HEMATOCRIT: 23.9 % — AB (ref 36.0–46.0)
Hemoglobin: 7.8 g/dL — ABNORMAL LOW (ref 12.0–15.0)
LYMPHS ABS: 0.9 10*3/uL (ref 0.7–4.0)
LYMPHS PCT: 7 %
MCH: 29 pg (ref 26.0–34.0)
MCHC: 32.6 g/dL (ref 30.0–36.0)
MCV: 88.8 fL (ref 78.0–100.0)
MONO ABS: 0.6 10*3/uL (ref 0.1–1.0)
Monocytes Relative: 4 %
NEUTROS ABS: 11.8 10*3/uL — AB (ref 1.7–7.7)
Neutrophils Relative %: 89 %
Platelets: 325 10*3/uL (ref 150–400)
RBC: 2.69 MIL/uL — AB (ref 3.87–5.11)
RDW: 16.1 % — AB (ref 11.5–15.5)
WBC: 13.3 10*3/uL — ABNORMAL HIGH (ref 4.0–10.5)

## 2016-02-15 LAB — PROTIME-INR
INR: 1.54
Prothrombin Time: 18.7 seconds — ABNORMAL HIGH (ref 11.4–15.2)

## 2016-02-15 LAB — APTT: APTT: 37 s — AB (ref 24–36)

## 2016-02-15 LAB — VANCOMYCIN, RANDOM: VANCOMYCIN RM: 16

## 2016-02-15 MED ORDER — NEPRO/CARBSTEADY PO LIQD
1000.0000 mL | ORAL | Status: DC
  Administered 2016-02-15 – 2016-02-21 (×7): 1000 mL
  Filled 2016-02-15 (×10): qty 1000

## 2016-02-15 MED ORDER — HEPARIN SODIUM (PORCINE) 1000 UNIT/ML DIALYSIS: 1000.0000 [IU] | INTRAMUSCULAR | Status: DC | PRN

## 2016-02-15 MED ORDER — MIDAZOLAM HCL 2 MG/2ML IJ SOLN
1.0000 mg | INTRAMUSCULAR | Status: DC | PRN
  Administered 2016-02-15 – 2016-02-20 (×3): 1 mg via INTRAVENOUS
  Filled 2016-02-15 (×2): qty 2

## 2016-02-15 MED ORDER — DEXTROSE 5 % IV SOLN
2.0000 g | INTRAVENOUS | Status: DC
  Administered 2016-02-15: 2 g via INTRAVENOUS
  Filled 2016-02-15: qty 2

## 2016-02-15 MED ORDER — PIPERACILLIN-TAZOBACTAM IN DEX 2-0.25 GM/50ML IV SOLN: 2.2500 g | Freq: Four times a day (QID) | INTRAVENOUS | Status: DC

## 2016-02-15 MED ORDER — MIDAZOLAM HCL 2 MG/2ML IJ SOLN
INTRAMUSCULAR | Status: AC
  Administered 2016-02-16: 1 mg via INTRAVENOUS
  Filled 2016-02-15: qty 2

## 2016-02-15 MED ORDER — PRO-STAT SUGAR FREE PO LIQD
60.0000 mL | Freq: Three times a day (TID) | ORAL | Status: DC
  Administered 2016-02-15 – 2016-02-22 (×21): 60 mL
  Filled 2016-02-15 (×22): qty 60

## 2016-02-15 MED ORDER — LIDOCAINE HCL (PF) 1 % IJ SOLN: 5.0000 mL | INTRAMUSCULAR | Status: DC | PRN

## 2016-02-15 MED ORDER — PIPERACILLIN-TAZOBACTAM 3.375 G IVPB 30 MIN
3.3750 g | Freq: Two times a day (BID) | INTRAVENOUS | Status: DC
  Administered 2016-02-15 – 2016-02-16 (×2): 3.375 g via INTRAVENOUS
  Filled 2016-02-15 (×3): qty 50

## 2016-02-15 MED ORDER — PRO-STAT SUGAR FREE PO LIQD: 30.0000 mL | Freq: Two times a day (BID) | ORAL | Status: DC

## 2016-02-15 MED ORDER — ALTEPLASE 2 MG IJ SOLR
2.0000 mg | Freq: Once | INTRAMUSCULAR | Status: DC | PRN
  Filled 2016-02-15: qty 2

## 2016-02-15 MED ORDER — VANCOMYCIN HCL 500 MG IV SOLR
500.0000 mg | INTRAVENOUS | Status: DC | PRN
  Filled 2016-02-15: qty 500

## 2016-02-15 MED ORDER — SODIUM CHLORIDE 0.9 % IV SOLN: 100.0000 mL | INTRAVENOUS | Status: DC | PRN

## 2016-02-15 MED ORDER — LIDOCAINE-PRILOCAINE 2.5-2.5 % EX CREA
1.0000 "application " | TOPICAL_CREAM | CUTANEOUS | Status: DC | PRN
  Filled 2016-02-15: qty 5

## 2016-02-15 MED ORDER — PENTAFLUOROPROP-TETRAFLUOROETH EX AERO: 1.0000 "application " | INHALATION_SPRAY | CUTANEOUS | Status: DC | PRN

## 2016-02-15 MED ORDER — VITAL HIGH PROTEIN PO LIQD: 1000.0000 mL | ORAL | Status: DC

## 2016-02-15 MED ORDER — VANCOMYCIN HCL IN DEXTROSE 750-5 MG/150ML-% IV SOLN
750.0000 mg | INTRAVENOUS | Status: DC | PRN
  Filled 2016-02-15: qty 150

## 2016-02-15 NOTE — Progress Notes (Signed)
Pharmacy Antibiotic Note  Samantha Pittman is a 40 y.o. female admitted on 01/15/2016 with sepsis and strep virdans in 1/2 blood cultures.  Pharmacy has been consulted for vancomycin and zosyn dosing. Patient with acute renal failure and to begin HD today. Pre-HD vancomycin level this morning is 16. Patient is scheduled to receive 3 hrs of HD at a BFR 250 ml/min.   Plan: Give vancomycin 500 mg x1 after HD today  Monitor future HD plans and pre-HD vancomycin levels as needed (goal 15-25) Zosyn 3.375g IV q12h (4hr infusion) Monitor C&S, CBC and length of therapy  Weight: 162 lb 7.7 oz (73.7 kg)  Temp (24hrs), Avg:99.4 F (37.4 C), Min:98.5 F (36.9 C), Max:101 F (38.3 C)   Recent Labs Lab 01/25/2016 1711 02/12/16 0444 02/13/16 0215 02/13/16 1711 02/14/16 0238 02/15/16 0229 02/15/16 1025  WBC 16.8* 13.5* 11.2*  --  10.7* 13.3*  --   CREATININE 6.47* 7.13* 8.60* 9.54* 9.56* 11.56*  --   LATICACIDVEN  --   --   --  1.0  --   --   --   VANCORANDOM  --   --   --   --   --   --  16    CrCl cannot be calculated (Unknown ideal weight.).    Allergies  Allergen Reactions  . Dilantin [Phenytoin Sodium Extended]     Antimicrobials this admission:  11/2 vanc 1g x1  11/2 zosyn >> 11/4; resumed 11/4 per ID 11/4 CTX >> 11/4  Dose adjustments this admission:  11/4 VR pre-HD: 16; will give 500 mg post HD (using lower dose d/t patient only getting HD at BFR ~250 ml/min)  Microbiology results:  11/2 BCx: pending (1/2 strep viridans) -BCID 1/2 strep species  11/2 wound cx (breast): abundant gram pos cocci in pairs, abundant gram variable rod (re incubated) 10/31 MRSA PCR: neg   Thank you for allowing pharmacy to be a part of this patient's care.  Dimitri Ped, PharmD. PGY-2 Infectious Diseases Pharmacy Resident Pager: [phone] 02/15/2016 11:20 AM

## 2016-02-15 NOTE — Consult Note (Signed)
[facility]  Total days of antibiotics 3        Day 1 ceftriaxone        Day 2 pip-tazo               Reason for Consult: strep bacteremia    Referring Physician: ramaswamy  Active Problems:   CVA (cerebral vascular accident) ([place])   PRES (posterior reversible encephalopathy syndrome)   Acute encephalopathy   Acute renal failure (ARF) ([place])   Breast mass   Ventilator dependence (HCC)    HPI: Samantha Pittman is a 40 y.o. female with history of seizure and hypertension who presented to [facility] hospital originally with hypertensive emergency, with BP in 224/160, complaining of weakness, dizziness, and difficulty seeing out of left eye. During her presentation aslo had seizure. Other abonormalities included her Cr elevated at 2.94, plt of 51K.  It is unclear if she was unresponsive merely from post-ictal phase but also was given medication to manage her hypertension emergency and sBP was reported dropped down to systolic of 935T. She was intubated for airway protection and sent to [facility] for further management.   Her initial imaging with [facility] showed new hypodensity to right caudate nucleus and edema to cerebelum concerning for infarct with likely element of PRES. Her neuro exam remains limited to withdrawing to pain, sluggish pupilar response to light, doll's eye mvmt is sugglish, no spontaneous movement,  Remains in comatose state 2/2 brainstem edema, from hypertensive encephalopathy with acute infarcts in both cerebral hemisphere. She continues to be on full vent support due to inability to protect airway  Her kidney function thought to be due to ATN continues to worsen requiring HD. 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  Her infectious work up includes 2  sets of blood cx and wound cx -> 1 of 4 bottles showing viridans strep. Wound cx is polymicrobial. She had fever up to 100.9 yesterday but afebrile this morning. Leukocytosis of 13.3K today. ID asked to weigh in on abtx regimen, currently on piptazo, concerning that she is having infection due to blood cx results, wound cx results in setting of fever and leukocytosis  Started HD today that appears she is tolerating   Past Medical History:  Diagnosis Date  . High blood pressure   . Seizures     Allergies:  Allergies  Allergen Reactions  . Dilantin [Phenytoin Sodium Extended]     MEDICATIONS: . chlorhexidine  15 mL Mouth Rinse BID  . famotidine (PEPCID) IV  20 mg Intravenous Q24H  . feeding supplement (NEPRO CARB STEADY)  1,000 mL Per Tube Q24H  . feeding supplement (PRO-STAT SUGAR FREE 64)  60 mL Per Tube TID  . mouth rinse  15 mL Mouth Rinse 10 times per day  . OXcarbazepine  900 mg Per Tube BID  . piperacillin-tazobactam (ZOSYN)  IV  2.25 g Intravenous Q6H  . sodium bicarbonate  650 mg Per NG tube TID    Social History  Substance Use Topics  . Smoking status: Never Smoker  . Smokeless tobacco: Never Used  . Alcohol use No    Family History  Problem Relation Age of Onset  . Diabetes    . High blood pressure      Review of Systems -  Unable to obtain due to encephalopathy/vent/comatose  state  OBJECTIVE: Temp:  [98.5 F (36.9 C)-101 F (38.3 C)] 98.5 F (36.9 C) (11/04 0837) Pulse Rate:  [103-129] 119 (11/04 1000) Resp:  [12-24] 13 (11/04 1000) BP: (110-169)/(74-106) 137/85 (11/04 1000) SpO2:  [89 %-100 %] 100 % (11/04 1000) FiO2 (%):  [40 %-60 %] 40 % (11/04 1000) Weight:  [162 lb 7.7 oz (73.7 kg)] 162 lb 7.7 oz (73.7 kg) (11/04 0347) Physical Exam  Constitutional:  Intubated, eyes closed, no sedation. appears well-developed and well-nourished. No distress.  HENT: /AT, OTT in place Mouth/Throat: No oropharyngeal exudate.  Cardiovascular: Normal rate,  regular rhythm and normal heart sounds. Exam reveals no gallop and no friction rub. No murmur heard.  Pulmonary/Chest: Effort normal and breath sounds normal. No respiratory distress.  has no wheezes.  Chest wall = left breast larger then right, induration medial aspect with ulceration Abdominal: Soft. Bowel sounds are normal.  exhibits no distension. There is no tenderness.  Neurological: remains intubated, does not follow commans, eyes closed, comatose,  Skin: Skin is warm and dry. No rash noted. No erythema.  Psychiatric: a normal mood and affect.  behavior is normal.    LABS: Results for orders placed or performed during the hospital encounter of 01/30/2016 (from the past 48 hour(s))  Comprehensive metabolic panel     Status: Abnormal   Collection Time: 02/13/16  5:11 PM  Result Value Ref Range   Sodium 140 135 - 145 mmol/L   Potassium 5.3 (H) 3.5 - 5.1 mmol/L   Chloride 110 101 - 111 mmol/L   CO2 15 (L) 22 - 32 mmol/L   Glucose, Bld 112 (H) 65 - 99 mg/dL   BUN 103 (H) 6 - 20 mg/dL   Creatinine, Ser 9.54 (H) 0.44 - 1.00 mg/dL   Calcium 8.2 (L) 8.9 - 10.3 mg/dL   Total Protein 5.5 (L) 6.5 - 8.1 g/dL   Albumin 2.4 (L) 3.5 - 5.0 g/dL   AST 210 (H) 15 - 41 U/L   ALT 271 (H) 14 - 54 U/L   Alkaline Phosphatase 115 38 - 126 U/L   Total Bilirubin 0.6 0.3 - 1.2 mg/dL   GFR calc non Af Amer 4 (L) >60 mL/min   GFR calc Af Amer 5 (L) >60 mL/min    Comment: (NOTE) The eGFR has been calculated using the CKD EPI equation. This calculation has not been validated in all clinical situations. eGFR's persistently <60 mL/min signify possible Chronic Kidney Disease.    Anion gap 15 5 - 15  Lactic acid, plasma     Status: None   Collection Time: 02/13/16  5:11 PM  Result Value Ref Range   Lactic Acid, Venous 1.0 0.5 - 1.9 mmol/L  I-STAT 3, arterial blood gas (G3+)     Status: Abnormal   Collection Time: 02/13/16  7:55 PM  Result Value Ref Range   pH, Arterial 7.323 (L) 7.350 - 7.450   pCO2  arterial 32.1 32.0 - 48.0 mmHg   pO2, Arterial 83.0 83.0 - 108.0 mmHg   Bicarbonate 16.7 (L) 20.0 - 28.0 mmol/L   TCO2 18 0 - 100 mmol/L   O2 Saturation 96.0 %   Acid-base deficit 9.0 (H) 0.0 - 2.0 mmol/L   Patient temperature 97.7 F    Collection site RADIAL, ALLEN'S TEST ACCEPTABLE    Drawn by Operator    Sample type ARTERIAL   Renal function panel     Status: Abnormal   Collection Time: 02/14/16  2:38 AM  Result Value  Ref Range   Sodium 139 135 - 145 mmol/L   Potassium 5.3 (H) 3.5 - 5.1 mmol/L   Chloride 107 101 - 111 mmol/L   CO2 16 (L) 22 - 32 mmol/L   Glucose, Bld 108 (H) 65 - 99 mg/dL   BUN 110 (H) 6 - 20 mg/dL   Creatinine, Ser 9.56 (H) 0.44 - 1.00 mg/dL   Calcium 8.8 (L) 8.9 - 10.3 mg/dL   Phosphorus 10.6 (H) 2.5 - 4.6 mg/dL   Albumin 2.5 (L) 3.5 - 5.0 g/dL   GFR calc non Af Amer 4 (L) >60 mL/min   GFR calc Af Amer 5 (L) >60 mL/min    Comment: (NOTE) The eGFR has been calculated using the CKD EPI equation. This calculation has not been validated in all clinical situations. eGFR's persistently <60 mL/min signify possible Chronic Kidney Disease.    Anion gap 16 (H) 5 - 15  CBC with Differential/Platelet     Status: Abnormal   Collection Time: 02/14/16  2:38 AM  Result Value Ref Range   WBC 10.7 (H) 4.0 - 10.5 K/uL   RBC 2.50 (L) 3.87 - 5.11 MIL/uL   Hemoglobin 7.3 (L) 12.0 - 15.0 g/dL   HCT 22.1 (L) 36.0 - 46.0 %   MCV 88.4 78.0 - 100.0 fL   MCH 29.2 26.0 - 34.0 pg   MCHC 33.0 30.0 - 36.0 g/dL   RDW 15.9 (H) 11.5 - 15.5 %   Platelets 238 150 - 400 K/uL   Neutrophils Relative % 90 %   Neutro Abs 9.6 (H) 1.7 - 7.7 K/uL   Lymphocytes Relative 6 %   Lymphs Abs 0.7 0.7 - 4.0 K/uL   Monocytes Relative 4 %   Monocytes Absolute 0.4 0.1 - 1.0 K/uL   Eosinophils Relative 0 %   Eosinophils Absolute 0.0 0.0 - 0.7 K/uL   Basophils Relative 0 %   Basophils Absolute 0.0 0.0 - 0.1 K/uL  Magnesium     Status: Abnormal   Collection Time: 02/14/16  2:38 AM  Result Value  Ref Range   Magnesium 2.6 (H) 1.7 - 2.4 mg/dL  Protime-INR     Status: Abnormal   Collection Time: 02/14/16  2:38 AM  Result Value Ref Range   Prothrombin Time 16.0 (H) 11.4 - 15.2 seconds   INR 1.27   APTT     Status: None   Collection Time: 02/14/16  2:38 AM  Result Value Ref Range   aPTT 35 24 - 36 seconds  Fibrinogen     Status: Abnormal   Collection Time: 02/14/16  2:38 AM  Result Value Ref Range   Fibrinogen 706 (H) 210 - 475 mg/dL  Procalcitonin     Status: None   Collection Time: 02/14/16  2:38 AM  Result Value Ref Range   Procalcitonin 18.68 ng/mL    Comment:        Interpretation: PCT >= 10 ng/mL: Important systemic inflammatory response, almost exclusively due to severe bacterial sepsis or septic shock. (NOTE)         ICU PCT Algorithm               Non ICU PCT Algorithm    ----------------------------     ------------------------------         PCT < 0.25 ng/mL                 PCT < 0.1 ng/mL     Stopping of antibiotics  Stopping of antibiotics       strongly encouraged.               strongly encouraged.    ----------------------------     ------------------------------       PCT level decrease by               PCT < 0.25 ng/mL       >= 80% from peak PCT       OR PCT 0.25 - 0.5 ng/mL          Stopping of antibiotics                                             encouraged.     Stopping of antibiotics           encouraged.    ----------------------------     ------------------------------       PCT level decrease by              PCT >= 0.25 ng/mL       < 80% from peak PCT        AND PCT >= 0.5 ng/mL             Continuing antibiotics                                              encouraged.       Continuing antibiotics            encouraged.    ----------------------------     ------------------------------     PCT level increase compared          PCT > 0.5 ng/mL         with peak PCT AND          PCT >= 0.5 ng/mL             Escalation of  antibiotics                                          strongly encouraged.      Escalation of antibiotics        strongly encouraged.   Renal function panel     Status: Abnormal   Collection Time: 02/15/16  2:29 AM  Result Value Ref Range   Sodium 141 135 - 145 mmol/L   Potassium 5.7 (H) 3.5 - 5.1 mmol/L   Chloride 105 101 - 111 mmol/L   CO2 16 (L) 22 - 32 mmol/L   Glucose, Bld 109 (H) 65 - 99 mg/dL   BUN 146 (H) 6 - 20 mg/dL   Creatinine, Ser 11.56 (H) 0.44 - 1.00 mg/dL   Calcium 8.7 (L) 8.9 - 10.3 mg/dL   Phosphorus 13.2 (H) 2.5 - 4.6 mg/dL    Comment: RESULTS CONFIRMED BY MANUAL DILUTION   Albumin 2.5 (L) 3.5 - 5.0 g/dL   GFR calc non Af Amer 4 (L) >60 mL/min   GFR calc Af Amer 4 (L) >60 mL/min    Comment: (NOTE) The eGFR has been calculated using the CKD EPI equation. This calculation has not been validated in all clinical  situations. eGFR's persistently <60 mL/min signify possible Chronic Kidney Disease.    Anion gap 20 (H) 5 - 15  CBC with Differential/Platelet     Status: Abnormal   Collection Time: 02/15/16  2:29 AM  Result Value Ref Range   WBC 13.3 (H) 4.0 - 10.5 K/uL   RBC 2.69 (L) 3.87 - 5.11 MIL/uL   Hemoglobin 7.8 (L) 12.0 - 15.0 g/dL   HCT 23.9 (L) 36.0 - 46.0 %   MCV 88.8 78.0 - 100.0 fL   MCH 29.0 26.0 - 34.0 pg   MCHC 32.6 30.0 - 36.0 g/dL   RDW 16.1 (H) 11.5 - 15.5 %   Platelets 325 150 - 400 K/uL   Neutrophils Relative % 89 %   Neutro Abs 11.8 (H) 1.7 - 7.7 K/uL   Lymphocytes Relative 7 %   Lymphs Abs 0.9 0.7 - 4.0 K/uL   Monocytes Relative 4 %   Monocytes Absolute 0.6 0.1 - 1.0 K/uL   Eosinophils Relative 0 %   Eosinophils Absolute 0.0 0.0 - 0.7 K/uL   Basophils Relative 0 %   Basophils Absolute 0.0 0.0 - 0.1 K/uL  Magnesium     Status: Abnormal   Collection Time: 02/15/16  2:29 AM  Result Value Ref Range   Magnesium 2.9 (H) 1.7 - 2.4 mg/dL  Protime-INR     Status: Abnormal   Collection Time: 02/15/16  2:29 AM  Result Value Ref Range    Prothrombin Time 18.7 (H) 11.4 - 15.2 seconds   INR 1.54   APTT     Status: Abnormal   Collection Time: 02/15/16  2:29 AM  Result Value Ref Range   aPTT 37 (H) 24 - 36 seconds    Comment:        IF BASELINE aPTT IS ELEVATED, SUGGEST PATIENT RISK ASSESSMENT BE USED TO DETERMINE APPROPRIATE ANTICOAGULANT THERAPY.   Fibrinogen     Status: Abnormal   Collection Time: 02/15/16  2:29 AM  Result Value Ref Range   Fibrinogen >800 (H) 210 - 475 mg/dL    Comment: REPEATED TO VERIFY  Hepatic function panel     Status: Abnormal   Collection Time: 02/15/16  2:29 AM  Result Value Ref Range   Total Protein 6.8 6.5 - 8.1 g/dL   Albumin 2.5 (L) 3.5 - 5.0 g/dL   AST 33 15 - 41 U/L   ALT 178 (H) 14 - 54 U/L   Alkaline Phosphatase 114 38 - 126 U/L   Total Bilirubin 0.8 0.3 - 1.2 mg/dL   Bilirubin, Direct 0.3 0.1 - 0.5 mg/dL   Indirect Bilirubin 0.5 0.3 - 0.9 mg/dL    MICRO: 11/2 wound cx - pending 11/1 blood x 1 of 4 bottles with viridans strep bacteremia IMAGING: [place] Core Biopsy  Result Date: 02/13/2016 INDICATION: Patient admitted with multi system organ failure found to have a fungating mass within the medial aspect the left breast. Request made for ultrasound-guided breast biopsy for tissue diagnostic purposes. EXAM: ULTRASOUND GUIDED BREAST BIOPSY COMPARISON:  None. MEDICATIONS: None ANESTHESIA/SEDATION: None, the patient is currently intubated and sedated in the ICU. COMPLICATIONS: None immediate. PROCEDURE: Informed written consent was obtained from the patient's family after a discussion of the risks, benefits and alternatives to treatment. The patient understands and consents the procedure. A timeout was performed prior to the initiation of the procedure. Note, this procedure was performed in the medical ICU given patient's medical fragility. Ultrasound scanning was performed of the palpable area of concern involving  the medial aspect of the left breast demonstrating a large (at least 2.8 x  2.3 cm) hypoechoic solid pass within the 9 o'clock position of the left breast, approximately 5 cm from the nipple. The procedure was planned. The medial aspect the left breast was prepped and draped in the usual sterile fashion. The overlying soft tissues were anesthetized with 1% lidocaine with epinephrine. An 18 gauge core needle biopsy device was a utilized to obtain 6 core needle biopsies. Multiple ultrasound images were saved for procedural documentation purposes. Superficial hemostasis was obtained with manual compression. Post procedural scanning was negative for definitive area of hemorrhage. A dressing was placed. The patient tolerated the procedure well without immediate post procedural complication. IMPRESSION: Technically successful ultrasound guided of palpable mass within medial aspect of the left breast. Note, a biopsy clip was not left in place as this procedure was performed at the bedside in the ICU to help aid the patient's acute management. Would recommend a formal diagnostic workup following the resolution of the patient's acute medical comorbidities. Electronically Signed   By: Sandi Mariscal M.D.   On: 02/13/2016 17:00   Dg Chest Port 1 View  Result Date: 02/15/2016 CLINICAL DATA:  Check endotracheal tube placement EXAM: PORTABLE CHEST 1 VIEW COMPARISON:  02/15/2016 FINDINGS: Temporary dialysis catheter is noted in the right jugular vein in satisfactory position. Endotracheal tube has been advanced slightly and now lies approximately 3.8 cm above the carina. Nasogastric catheter extends into the distal esophagus but does not cross the gastroesophageal junction. This could be advanced. The lungs are well aerated bilaterally. No focal infiltrate is seen. Stable right peritracheal density is noted. IMPRESSION: Tubes and lines as described above. The nasogastric catheter remains in the distal esophagus and should be advanced. The remainder of the exam is stable from the prior study.  Electronically Signed   By: Inez Catalina M.D.   On: 02/15/2016 10:46   Dg Chest Port 1 View  Result Date: 02/15/2016 CLINICAL DATA:  Ventilator dependence. EXAM: PORTABLE CHEST 1 VIEW COMPARISON:  02/14/2016 FINDINGS: Endotracheal tube remains in place with tip 6.5 cm above the carina. Right jugular catheter terminates over the lower SVC, unchanged. Enteric tube terminates over the distal esophagus, similar to prior. Evaluation of mediastinal contours is limited by patient rotation. Increased soft tissue density in the right paratracheal region is unchanged as described on yesterday's examination. Cardiac silhouette is within normal limits for size. No airspace consolidation, edema, pleural effusion, or pneumothorax is identified. IMPRESSION: 1. Unchanged appearance of the chest.  Clear lungs. 2. Enteric tube in the distal esophagus.  Suggest advancing. Electronically Signed   By: Logan Bores M.D.   On: 02/15/2016 07:21   Dg Chest Port 1 View  Result Date: 02/14/2016 CLINICAL DATA:  Hypoxia with central catheter placement EXAM: PORTABLE CHEST 1 VIEW COMPARISON:  February 14, 2016 FINDINGS: Central catheter tip is in the superior vena cava. Endotracheal tube tip is 5.3 cm above the carina. Nasogastric tube tip is at the gastroesophageal junction with the side port above the gastroesophageal junction. No pneumothorax. There is no edema or consolidation. Heart size and pulmonary vascular normal. Soft tissue fullness in the right peritracheal region is stable and concerning for potential right paratracheal region adenopathy. No bone lesions. IMPRESSION: Tube and catheter positions as described without pneumothorax. Note that the nasogastric tube tip is at the gastroesophageal junction with the side port above the gastroesophageal junction. Advise advancing nasogastric tube approximately 10 cm to insure that both nasogastric  tube tip and side port are well within the stomach. Soft tissue fullness the right  paratracheal region of uncertain etiology. It may be prudent to consider contrast enhanced chest CT to exclude possible adenopathy in this area. These results will be called to the ordering clinician or representative by the Radiologist Assistant, and communication documented in the PACS or zVision Dashboard. Electronically Signed   By: Lowella Grip III M.D.   On: 02/14/2016 16:25   Dg Chest Port 1 View  Result Date: 02/14/2016 CLINICAL DATA:  Hypoxia EXAM: PORTABLE CHEST 1 VIEW COMPARISON:  February 12, 2016 FINDINGS: Endotracheal tube tip is 5.6 cm above the carina. Nasogastric tube tip is at the gastroesophageal junction with the side port above the gastroesophageal junction. No pneumothorax. There is mild right base atelectasis. Lungs elsewhere clear. Heart size and pulmonary vascularity are normal. No adenopathy. No bone lesions. IMPRESSION: Tube positions as described without pneumothorax. Note that the nasogastric tube tip is at the gastroesophageal junction. Advise advancing nasogastric tube approximately 10 cm to insure that the nasogastric tube tip and side port are well within the stomach. There is right base atelectasis. Lungs elsewhere clear. Stable cardiac silhouette. These results will be called to the ordering clinician or representative by the Radiologist Assistant, and communication documented in the PACS or zVision Dashboard. Electronically Signed   By: Lowella Grip III M.D.   On: 02/14/2016 07:06   Assessment/Plan:  40yo F with hx of seizure and hypertension admitted with hypertensive emergency found to have acute cerebral infarct, seizure, hypertensive encephalopathy,  compounded by PRES 2/2 hypotension at outside hospital. Remains intubated, requiring HD for AKI. On exam found to have ulcerative breast mass that is c/w breast malignancy on pathology bx. She is on piptazo to cover polymicrobial breast ulcerative wound.  Streptococcal bacteremia = likely a contaminant with only 1  of 4 bottles. Currently being covered by piptazo  Breast ulcerative lesions = likely due to necrosis from malignancy. They tend to be polymicrobial. Await identification before narrrowing abtx. For now, continue on piptazo and will narrow based on further data  Unclear what her prognosis will be from recovering from CNS insult. For now, will continue to treat for underlying infection, at this time would be ulcerative skin lesion from malignancy  Health maintenance = hiv and hep b is pending

## 2016-02-15 NOTE — Progress Notes (Signed)
Arrived to patient room 72M-10 at 1345, treatment delayed d/t multiple machine tests in room.  Reviewed treatment plan and this RN agrees with plan.  Report received from bedside RN, Caryl Pina.  Consent unable to obtain.  Patient Ventilated, sedated.   Lung sounds rhoncus to ausculation in all fields. Generalized  edema. Cardiac:  ST.  Removed caps and cleansed RIJ catheter with chlorhedxidine.  Aspirated ports of heparin and flushed them with saline per protocol.  Connected and secured lines, initiated treatment at 1439.  UF Goal of 1061mL and net fluid removal .5L.  Will continue to monitor.

## 2016-02-15 NOTE — Progress Notes (Signed)
eLink Physician-Brief Progress Note Patient Name: Samantha Pittman DOB: 1975/10/19 MRN: [id number]   Date of Service  02/15/2016  HPI/Events of Note  Agitation.  eICU Interventions  Will order: 1. Versed 1 mg IV Q 1 hour PRN agitation or sedation.      Intervention Category Minor Interventions: Agitation / anxiety - evaluation and management  Sommer,Steven Eugene 02/15/2016, 6:42 PM

## 2016-02-15 NOTE — Progress Notes (Signed)
RT note-EET advanced per order.

## 2016-02-15 NOTE — Progress Notes (Signed)
Dialysis treatment completed.  1000 mL ultrafiltrated.  500 mL net fluid removal.  Patient status unchanged. Lung sounds diminished to ausculation in all fields. Generalized edema. Cardiac: ST.  Cleansed RIJ catheter with chlorhexidine.  Disconnected lines and flushed ports with saline per protocol.  Ports locked with heparin and capped per protocol.    Report given to bedside, RN Caryl Pina.

## 2016-02-15 NOTE — Progress Notes (Addendum)
PULMONARY / CRITICAL CARE MEDICINE   Name: Samantha Pittman MRN: [id number] DOB: Sep 07, 1975    ADMISSION DATE:  01/30/2016 CONSULTATION DATE:  02/06/2016  REFERRING MD:  Dr, Langley Gauss / [facility]   CHIEF COMPLAINT:  Weakness / Dizziness   BRIEF:  40 y/o F with PMH of tubal ligation, HTN,seizures who presented to [facility] on 10/30 with a 6 day history of weakness, dizziness and difficulty seeing out of her left eye.    Initial evaluation found the patient to be significantly hypertensive with presenting pressure of 224/160 (MAP 81).  Initial labs Na 135, K 3.6, Cl 93, AG 24, BUN 36, sr cr 2.94, glucose 184, mag 2.5, AST 31/ALT 14, alk phos 136, TSH 0.37, WBC 11.3, Hgb 11, and platelets 51.  UA was negative.  She was treated with captopril, ativan, amlodipine and labetalol to reduce blood pressure.  The patient was admitted to [facility] for further evaluation.  She later had a seizure.  Per report, she was felt to be post ictal while on the medical floor.  Am of 10/31, she remained altered and pupils were unequal.  Follow up CT of the head was obtained which demonstrated an abnormal new hypodensity in the head of the right caudate nucleus extending into the right lentiform nucleus, acute or early subacute infarct in this vicinity.  The patient was intubated and transferred to [facility] for further evaluation.    STUDIES:  EEG 10/31:  Mild generalized nonspecific continuous slowing of cerebral activity which can be seen with sedating medications as well as metabolic and toxic encephalopathies. No evidence of epileptiform activity was recorded. MRI/MRA Head 11/1 >> Extensive edema affecting bilateral pons and medulla with associated brainstem swelling. No associated hemorrhage. Superimposed scattered acute infarcts both cerebral hemispheres.  [place] Left Breast 11/1 >>  Renal Ultrasound: No significant abnormality    LINES/TUBES: OETT 10/31 >>  OGT 10/31 >> FOLEY 10/31  >> PIV x2  SIGNIFICANT EVENTS: 10/30 - Admit to [facility] with weakness, dizziness.  Hypertensive emergency.  Seizure > AMS,  10/31 - AMS continued, CT head worrisome for CVA, transferred to [facility] 11/1: MRI findings perhaps an usual appearance of Severe Posterior Reversible Encephalopathy Syndrome.  11/2: Sepsis protocol initiated for fevers, elevated WBC, and elevated procalcitonin. ECHO with EF 65-70% and mildly increased pulmonary artery systolic pressure. BIopsy left breast mass 02/14/16 - Worsening of Cr and Oliguria. Lasix given. Still very obtunded. Per RN, only grimaces with oral care otherwise not very responsive. Start HD   SUBJECTIVE/OVERNIGHT/INTERVAL HX  02/15/16 - Making urine. Renal will do HD . Remains unresponsive - doing PSV. 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 and growing strep viridan from blood culture 02/12/16  VITAL SIGNS: BP 128/76   Pulse (!) 115   Temp 98.5 F (36.9 C) (Oral)   Resp 17   Wt 73.7 kg (162 lb 7.7 oz)   LMP  (LMP Unknown)   SpO2 100%   BMI 26.22 kg/m   HEMODYNAMICS:    VENTILATOR SETTINGS: Vent Mode: PRVC FiO2 (%):  [40 %-60 %] 40 % Set Rate:  [12 bmp] 12 bmp Vt Set:  [400 mL] 400 mL PEEP:  [5 cmH20] 5 cmH20 Pressure Support:  [5 cmH20] 5 cmH20 Plateau Pressure:  [12 cmH20] 12 cmH20  INTAKE / OUTPUT: I/O last 3 completed shifts: In: 650 [I.V.:300; IV Piggyback:350] Out: 3125 [VVOHY:0737]  PHYSICAL EXAMINATION: General:  No acute distress. unresponsive Integument:  Warm & dry.  No rash on exposed skin. No bruising. HEENT:  No scleral injection or icterus. Pupils reactive but sluggish. Endotracheal tube in place.  Cardiovascular:  Regular rate. No edema. No appreciable JVD.  Pulmonary:  Clear bilaterally to auscultation. Symmetric chest wall rise on ventilator. Abdomen: Soft. Normal bowel sounds. Nondistended.  Musculoskeletal:  No joint deformity or effusion appreciated. Neurological:   No withdrawal to pain in extremities. No spontaneous movements. No response to voice. RASS -4. GAG +, PEER L + . Doing PSV +  LABS:  PULMONARY  Recent Labs Lab 01/13/2016 1625 02/13/16 1955  PHART 7.392 7.323*  PCO2ART 29.6* 32.1  PO2ART 169* 83.0  HCO3 17.6* 16.7*  TCO2  --  18  O2SAT 99.4 96.0    CBC  Recent Labs Lab 02/13/16 0215 02/14/16 0238 02/15/16 0229  HGB 7.6* 7.3* 7.8*  HCT 22.4* 22.1* 23.9*  WBC 11.2* 10.7* 13.3*  PLT 198 238 325    COAGULATION  Recent Labs Lab 02/09/2016 1711 02/13/16 0215 02/14/16 0238 02/15/16 0229  INR 1.17  1.22 1.38 1.27 1.54    CARDIAC   Recent Labs Lab 01/20/2016 2225 02/12/16 0444 02/12/16 1034 02/12/16 1701 02/12/16 2215  TROPONINI 1.88* 2.04* 1.97* 1.35* 2.23*   No results for input(s): PROBNP in the last 168 hours.   CHEMISTRY  Recent Labs Lab 01/25/2016 1711 02/12/16 0444 02/13/16 0215 02/13/16 1711 02/14/16 0238 02/15/16 0229  NA 138 138 138 140 139 141  K 4.1 4.8 5.2* 5.3* 5.3* 5.7*  CL 104 107 105 110 107 105  CO2 20* 18* 18* 15* 16* 16*  GLUCOSE 136* 115* 131* 112* 108* 109*  BUN 56* 64* 85* 103* 110* 146*  CREATININE 6.47* 7.13* 8.60* 9.54* 9.56* 11.56*  CALCIUM 8.3* 8.4* 8.5* 8.2* 8.8* 8.7*  MG 2.3 2.3 2.3  --  2.6* 2.9*  PHOS 6.7* 6.9* 8.6*  --  10.6* 13.2*   CrCl cannot be calculated (Unknown ideal weight.).   LIVER  Recent Labs Lab 02/02/2016 1711 02/13/16 0215 02/13/16 1711 02/14/16 0238 02/15/16 0229  AST 29  --  210*  --  33  ALT 18  --  271*  --  178*  ALKPHOS 100  --  115  --  114  BILITOT 0.4  --  0.6  --  0.8  PROT 6.3*  --  5.5*  --  6.8  ALBUMIN 3.1* 2.5* 2.4* 2.5* 2.5*  2.5*  INR 1.17  1.22 1.38  --  1.27 1.54     INFECTIOUS  Recent Labs Lab 02/12/16 1825 02/13/16 0215 02/13/16 1711 02/14/16 0238  LATICACIDVEN  --   --  1.0  --   PROCALCITON 0.73 8.77  --  18.68     ENDOCRINE CBG (last 3)   Recent Labs  02/12/16 1143  GLUCAP 114*          IMAGING x48h  - image(s) personally visualized  -   highlighted in bold [place] Core Biopsy  Result Date: 02/13/2016 INDICATION: Patient admitted with multi system organ failure found to have a fungating mass within the medial aspect the left breast. Request made for ultrasound-guided breast biopsy for tissue diagnostic purposes. EXAM: ULTRASOUND GUIDED BREAST BIOPSY COMPARISON:  None. MEDICATIONS: None ANESTHESIA/SEDATION: None, the patient is currently intubated and sedated in the ICU. COMPLICATIONS: None immediate. PROCEDURE: Informed written consent was obtained from the patient's family after a discussion of the risks, benefits and alternatives to treatment. The patient understands and consents the procedure. A timeout was performed prior to  the initiation of the procedure. Note, this procedure was performed in the medical ICU given patient's medical fragility. Ultrasound scanning was performed of the palpable area of concern involving the medial aspect of the left breast demonstrating a large (at least 2.8 x 2.3 cm) hypoechoic solid pass within the 9 o'clock position of the left breast, approximately 5 cm from the nipple. The procedure was planned. The medial aspect the left breast was prepped and draped in the usual sterile fashion. The overlying soft tissues were anesthetized with 1% lidocaine with epinephrine. An 18 gauge core needle biopsy device was a utilized to obtain 6 core needle biopsies. Multiple ultrasound images were saved for procedural documentation purposes. Superficial hemostasis was obtained with manual compression. Post procedural scanning was negative for definitive area of hemorrhage. A dressing was placed. The patient tolerated the procedure well without immediate post procedural complication. IMPRESSION: Technically successful ultrasound guided of palpable mass within medial aspect of the left breast. Note, a biopsy clip was not left in place as this procedure was performed  at the bedside in the ICU to help aid the patient's acute management. Would recommend a formal diagnostic workup following the resolution of the patient's acute medical comorbidities. Electronically Signed   By: Sandi Mariscal M.D.   On: 02/13/2016 17:00   Dg Chest Port 1 View  Result Date: 02/15/2016 CLINICAL DATA:  Ventilator dependence. EXAM: PORTABLE CHEST 1 VIEW COMPARISON:  02/14/2016 FINDINGS: Endotracheal tube remains in place with tip 6.5 cm above the carina. Right jugular catheter terminates over the lower SVC, unchanged. Enteric tube terminates over the distal esophagus, similar to prior. Evaluation of mediastinal contours is limited by patient rotation. Increased soft tissue density in the right paratracheal region is unchanged as described on yesterday's examination. Cardiac silhouette is within normal limits for size. No airspace consolidation, edema, pleural effusion, or pneumothorax is identified. IMPRESSION: 1. Unchanged appearance of the chest.  Clear lungs. 2. Enteric tube in the distal esophagus.  Suggest advancing. Electronically Signed   By: Logan Bores M.D.   On: 02/15/2016 07:21   Dg Chest Port 1 View  Result Date: 02/14/2016 CLINICAL DATA:  Hypoxia with central catheter placement EXAM: PORTABLE CHEST 1 VIEW COMPARISON:  February 14, 2016 FINDINGS: Central catheter tip is in the superior vena cava. Endotracheal tube tip is 5.3 cm above the carina. Nasogastric tube tip is at the gastroesophageal junction with the side port above the gastroesophageal junction. No pneumothorax. There is no edema or consolidation. Heart size and pulmonary vascular normal. Soft tissue fullness in the right peritracheal region is stable and concerning for potential right paratracheal region adenopathy. No bone lesions. IMPRESSION: Tube and catheter positions as described without pneumothorax. Note that the nasogastric tube tip is at the gastroesophageal junction with the side port above the gastroesophageal  junction. Advise advancing nasogastric tube approximately 10 cm to insure that both nasogastric tube tip and side port are well within the stomach. Soft tissue fullness the right paratracheal region of uncertain etiology. It may be prudent to consider contrast enhanced chest CT to exclude possible adenopathy in this area. These results will be called to the ordering clinician or representative by the Radiologist Assistant, and communication documented in the PACS or zVision Dashboard. Electronically Signed   By: Lowella Grip III M.D.   On: 02/14/2016 16:25   Dg Chest Port 1 View  Result Date: 02/14/2016 CLINICAL DATA:  Hypoxia EXAM: PORTABLE CHEST 1 VIEW COMPARISON:  February 12, 2016 FINDINGS: Endotracheal tube tip  is 5.6 cm above the carina. Nasogastric tube tip is at the gastroesophageal junction with the side port above the gastroesophageal junction. No pneumothorax. There is mild right base atelectasis. Lungs elsewhere clear. Heart size and pulmonary vascularity are normal. No adenopathy. No bone lesions. IMPRESSION: Tube positions as described without pneumothorax. Note that the nasogastric tube tip is at the gastroesophageal junction. Advise advancing nasogastric tube approximately 10 cm to insure that the nasogastric tube tip and side port are well within the stomach. There is right base atelectasis. Lungs elsewhere clear. Stable cardiac silhouette. These results will be called to the ordering clinician or representative by the Radiologist Assistant, and communication documented in the PACS or zVision Dashboard. Electronically Signed   By: Lowella Grip III M.D.   On: 02/14/2016 07:06       ASSESSMENT / PLAN:  NEUROLOGIC A:   Acute Encephalopathy - Unclear etiology. CVA vs PRES vs TTP. 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 MRI with extensive edema without hemorrhage. Superimposed scattered acute infarcts.  Seizure - Occurred w/ correction of hypertensive urgency/emergency. H/O  Seizure Disorder   02/15/16 - still unresponsive/comatose but neuro wise no change at rASS -4  P:   RASS goal: 0  Neurology Consulted & following Seizure Precautions AEDs per Neurology:  Trileptal avopid benzo and opioids   CARDIOVASCULAR A:  Hypertensive Emergency - Initially.  Hypertension  Elevated Troponin I - Likely subendocardial ischemia. H/O HTN - No medications as an outpatient.   = normotensive currently  P:  Continuous Telemetry Monitoring Vitals per Unit Protocol Goal SBP >95 Hydralazine prn    RENAL A:   Acute Renal Failure - Most likely ischemic ATN. 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 Prob due to sepsis Thrombocytopenia   - Mild and improving. Schistocytes on smear. Possible TTP with altered mental status but considered doubtful by heme   P:  Trending Cell Counts daily w/ CBC Trending Coags daily SCDs    PULMONARY A: Acute Respiratory Failure - Unable to protect airway with altered mental status.   P:   Full Vent Support Intermittent ABG & Portable CXR Weaning FiO2 for Sat >92% Albuterol neb prn  GASTROINTESTINAL A:   No acute issues.  P:   NPO Pepcid IV q24hr Star  Tube feedings   INFECTIOUS  MICROBIOLOGY: MRSA PCR 10/31:  Negative Blood Cx 11/1: Strep viridans Breast Wound Culture: Pending    A:   STrep viridans bacteremia confirmed n 02/15/16 (from admit 02/12/2016)  P:   Monitoring for signs Follow blood cultures  Vancomycin (11/2 >>)11/4  Zosyn (11/2 >> ) 11/4 Ceftriaxone high dose 02/15/16 >  I D consult called 02/15/16 Check HIV 02/15/16  ENDOCRINE A:   No acute issues.   P:   Monitor glucose with daily labs.   FAMILY   - Updates: No family at bedside  02/15/16     The patient is critically ill with multiple organ systems failure and requires high complexity decision making for assessment and support, frequent evaluation and titration of therapies, application of advanced monitoring technologies and extensive interpretation of multiple databases.   Critical Care Time devoted to patient care services described in this  note is  30  Minutes. This time reflects time of care of this signee Dr Brand Males. This critical care time does not reflect procedure time, or teaching time or supervisory time of PA/NP/Med student/Med Resident etc but could involve care discussion time    Dr. Brand Males, M.D., [facility].C.P Pulmonary and Critical Care Medicine Staff Physician [facility] Pager: [phone], If no answer or between  15:00h - 7:00h: call [phone]  02/15/2016 8:30 AM

## 2016-02-15 NOTE — Progress Notes (Signed)
Nestor, MD made aware of morning labs, k+5.7 and creat 11.56. No new orders at this time. Will wait for nephro to round this AM.

## 2016-02-15 NOTE — Progress Notes (Signed)
STROKE TEAM PROGRESS NOTE   HISTORY OF PRESENT ILLNESS (per record) Samantha Pittman is an 40 yo female with hx of seizures and HTN presented to outside hospital with hypertensive urgency. BP was lowered quickly and had a seizure. 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 She was intubated for airway protection and transferred to [facility] for further evaluation.  Patient was not administered IV t-PA. She was admitted to the neuro ICU for further evaluation and treatment.   SUBJECTIVE (INTERVAL HISTORY) Patient remains intubated and obtunded and unresponsive. Blood pressure remains well controlled. She has had no further seizures.  OBJECTIVE Temp:  [98.5 F (36.9 C)-101 F (38.3 C)] 98.5 F (36.9 C) (11/04 0325) Pulse Rate:  [103-129] 118 (11/04 0600) Cardiac Rhythm: Sinus tachycardia (11/04 0400) Resp:  [12-24] 12 (11/04 0600) BP: (110-169)/(74-106) 130/85 (11/04 0600) SpO2:  [89 %-100 %] 100 % (11/04 0600) FiO2 (%):  [40 %-60 %] 40 % (11/04 0400) Weight:  [73.7 kg (162 lb 7.7 oz)] 73.7 kg (162 lb 7.7 oz) (11/04 0347)  CBC:   Recent Labs Lab 02/14/16 0238 02/15/16 0229  WBC 10.7* 13.3*  NEUTROABS 9.6* 11.8*  HGB 7.3* 7.8*  HCT 22.1* 23.9*  MCV 88.4 88.8  PLT 238 [id number]    Basic Metabolic Panel:   Recent Labs Lab 02/14/16 0238 02/15/16 0229  NA 139 141  K 5.3* 5.7*  CL 107 105  CO2 16* 16*  GLUCOSE 108* 109*  BUN 110* 146*  CREATININE 9.56* 11.56*  CALCIUM 8.8* 8.7*  MG 2.6* 2.9*  PHOS 10.6* 13.2*    Lipid Panel:     Component Value Date/Time   CHOL 183 02/13/2016 0215   TRIG 83 02/13/2016 0215   HDL 66 02/13/2016 0215   CHOLHDL 2.8 02/13/2016 0215   VLDL 17 02/13/2016 0215   LDLCALC 100 (H) 02/13/2016 0215   HgbA1c:  Lab Results  Component Value Date   HGBA1C 4.7 (L) 02/13/2016   Urine Drug Screen:     Component Value Date/Time   LABOPIA NONE DETECTED  02/12/2016 1319   COCAINSCRNUR NONE DETECTED 02/12/2016 1319   LABBENZ NONE DETECTED 02/12/2016 1319   AMPHETMU NONE DETECTED 02/12/2016 1319   THCU NONE DETECTED 02/12/2016 1319   LABBARB NONE DETECTED 02/12/2016 1319    IMAGING  [place] Core Biopsy 02/13/2016 Technically successful ultrasound guided of palpable mass within medial aspect of the left breast.  Note, a biopsy clip was not left in place as this procedure was performed at the bedside in the ICU to help aid the patient's acute management.  Would recommend a formal diagnostic workup following the resolution of the patient's acute medical comorbidities.   Dg Chest Port 1 View 02/15/2016 1. Unchanged appearance of the chest.  Clear lungs.  2. Enteric tube in the distal esophagus.  Suggest advancing.   Dg Chest Port 1 View 02/14/2016 Tube and catheter positions as described without pneumothorax. Note that the nasogastric tube tip is at the gastroesophageal junction with the side port above the gastroesophageal junction. Advise advancing nasogastric tube approximately 10 cm to insure that both nasogastric tube tip and side port are well within the stomach.  Soft tissue fullness the right paratracheal region of uncertain etiology. It may be prudent to consider contrast enhanced chest CT to exclude possible adenopathy in this area.    Dg Chest Port 1 View 02/14/2016 Tube positions as described  without pneumothorax. Note that the nasogastric tube tip is at the gastroesophageal junction. Advise advancing nasogastric tube approximately 10 cm to insure that the nasogastric tube tip and side port are well within the stomach. There is right base atelectasis. Lungs elsewhere clear. Stable cardiac silhouette.    MRI/MRA  02/12/2016 1. Extensive edema affecting the bilateral pons and medulla with associated brainstem swelling. No associated hemorrhage. 2. Superimposed scattered acute infarcts both cerebral hemispheres affecting anterior and  posterior vascular territory use, and also affecting the cerebellum greater on the left. No associated hemorrhage or mass effect. 3. Intracranial MRA is negative for emergent large vessel occlusion. There is at most mild irregularity of the bilateral PCAs and the right ACA.    EEG  This EEG is abnormal with mild generalized nonspecific continuous slowing of cerebral activity which can be seen with sedating medications as well as metabolic and toxic encephalopathies. No evidence of epileptiform activity was recorded.  CUS Could not obtain images from right carotid due to poor patient position with head turned completely to the right. Tried to reposition patient multiple times but she kept head firmly to the right. Left :No significant (1-39%) ICA stenosis. Antegrade vertebral flow.  Echocardiogram 02/13/2016 Left ventricle:  The cavity size was normal. There was moderate concentric hypertrophy.  Systolic function was vigorous. The estimated ejection fraction was in the range of 65% to 70%.  Wall motion was normal; there were no regional wall motion  PHYSICAL EXAM middle aged african [place] lady who is intubated. Not on sedation . Afebrile. Head is nontraumatic. Neck is supple without bruit.    Cardiac exam no murmur or gallop. Lungs are clear to auscultation. Distal pulses are well felt.  Neurological Exam : Intubated. Comatose. Eyes are closed. Not following any commands. Right pupil is irregular not reactive. Left pupil 2 mm sluggishly reactive. Corneal reflexes are present bilaterally. Fundi could not be visualized. Doll's eye movements are sluggish. Patient has a cough and gag She does not grimace to facial pain. No spontaneous extremity movements. Patient will withdraw both lower extremities greater than upper extremity distant painful stimuli. No posturing. Deep tendon reflexes are 1+ symmetric. Plantars are downgoing.   ASSESSMENT/PLAN Samantha Pittman is a 40 y.o. female with  history of HTN and seizures presenting to outlying hospital with hypertensive emergency followed by seizure. She did not present with stroke symptoms. CT shows a stroke, age indeterminate.  Comatose state with poor neurological exam secondary to extensive brainstem edema and white matter lesions consistent with hypertensive encephalopathy. Superimposed acute infarcts in both cerebral hemispheres etiology unclear whether related to breast or other. Relatively sudden presentation following a GI illness with now fever and elevated white count raise concern for sepsis. Etiology of renal failure likely related to hypovolemia and acute tubular necrosis.  Other stroke  CT at outlying hospital with R caudate lacunar infarct  MRI - (see above) Edema and scattered acute infarcts both cerebral hemispheres.  MRA - negative for emergent large vessel occlusion  Carotid Doppler  Left :No significant (1-39%) ICA stenosis. Antegrade vertebral flow. (Right ICA - see above)   2D Echo - EF 65-70%. No cardiac source of emboli identified.  LDL  100  HgbA1c 4.7  SCDs for VTE prophylaxis Diet NPO time specified  No antithrombotic prior to admission, received one dose of aspirin 324 mg yesterday. No further aspirin given likely TTP  Ongoing aggressive stroke risk factor management  Therapy recommendations:  Can not evaluate now  Disposition:  pending   Seizure   Not related to stroke seen on CT  EEG generalized nonspecific slowing  Continue trileptal 300 bid  Continuous EEG - pending  Respiratory failure  Intubated after seizure for airway protection  CCM attending  Hypertensive Emergency  Not on home medications  Stable this am  Long-term BP goal normotensive  Other Active Problems  Acute kidney injury, Cr 11.56 possibly ATN. Potassium - 5.7  Unlikely TTP, Thrombocytopenia 130. Considering plasma exchange  Mild anemia 8.2  Ulcerating L breast mass; now known to be cancer based on  biopsy result  Sepsis   Electrolyte abnormalities  Hospital day # 4   CRITICAL CARE NEUROLOGY ATTENDING NOTE Patient was seen and examined by me personally. I independently viewed imaging studies, participated in medical decision making and plan of care. The laboratory and radiographic studies were personally reviewed by me.  ROS pertinent positives could not be fully documented due to LOC  Assessment and plan completed by me personally and fully documented above.  Agree with current management  BP better controlled.    Prognosis is concerning giving atypical appearance of the PRES and multiple co-morbidities.  Some MRI changes may in fact be ischemia.  Will continue to follow.  Condition unchanged   This patient is critically ill and at significant risk of neurological worsening, death and care requires constant monitoring of vital signs, hemodynamics,respiratory and cardiac monitoring, extensive review of multiple databases, frequent neurological assessment, discussion with family, other specialists and medical decision making of high complexity.  This critical care time does not reflect procedure time, or teaching time or supervisory time of PA/NP/Med Resident etc. but could involve care discussion time.  I spent 30 minutes of Neurocritical Care time in the care of  this patient.  SIGNED BY: Dr. Elissa Hefty       To contact Stroke Continuity provider, please refer to [email or web address] After hours, contact General Neurology

## 2016-02-15 NOTE — Progress Notes (Signed)
Nutrition Follow-up  DOCUMENTATION CODES:  Not applicable  INTERVENTION:  Initiate TF via OGT with Nepro at goal rate of 30 ml/h (720 ml per day) and Prostat 60 ml TID to provide 1896 kcals, 148 gm protein, 523 ml free water daily.  PEP-up protocol  NUTRITION DIAGNOSIS:  Inadequate oral intake related to inability to eat as evidenced by NPO status.  GOAL:  Patient will meet greater than or equal to 90% of their needs  MONITOR:  Diet advancement, Vent status, Labs, Weight trends, I & O's, Skin  REASON FOR ASSESSMENT:  Consult Enteral/tube feeding initiation and management  ASSESSMENT:  40 y/o F with PMH of tubal ligation, HTN,seizures who presented to [facility] on 10/30 with a 6 day history of weakness, dizziness and difficulty seeing out of her left eye. admitted  for further evaluation. She later had a seizure. The patient was intubated and transferred to [facility] for further evaluation.   Interval Sig events:  11-3 Due to ARF, HD cath placed  11-4 Bacteremia/Sepis confirmed. 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  No discernible edema  Per RN, pt will be dialyzed again today. No fluid to be removed  Patient is currently intubated on ventilator support. Not sedated but unreponsive MV:9.7 L/min Temp (24hrs), Avg:99.4 F (37.4 C), Min:98.5 F (36.9 C), Max:101 F (38.3 C)  Propofol: None  Labs reviewed: Hyperkalemia/phosphatemia worsening Medications: IV abx, Pepcid,    Recent Labs Lab 02/13/16 0215 02/13/16 1711 02/14/16 0238 02/15/16 0229  NA 138 140 139 141  K 5.2* 5.3* 5.3* 5.7*  CL 105 110 107 105  CO2 18* 15* 16* 16*  BUN 85* 103* 110* 146*  CREATININE 8.60* 9.54* 9.56* 11.56*  CALCIUM 8.5* 8.2* 8.8* 8.7*  MG 2.3  --  2.6* 2.9*  PHOS 8.6*  --  10.6* 13.2*  GLUCOSE 131*  112* 108* 109*   Diet Order:  Diet NPO time specified  Skin: Breat Mass-full thickness wound w/ necrosis- malignant  Last BM:  Unknown  Height:  Ht Readings from Last 1 Encounters:  10/04/14 5\' 6"  (1.676 m)   Weight:  Wt Readings from Last 1 Encounters:  02/15/16 162 lb 7.7 oz (73.7 kg)   Wt Readings from Last 10 Encounters:  02/15/16 162 lb 7.7 oz (73.7 kg)  10/04/14 185 lb 3.2 oz (84 kg)  09/22/13 177 lb (80.3 kg)  04/22/12 178 lb (80.7 kg)  Dosing weight: 161 lbs. (73.18 kg)  Ideal Body Weight:  59.1 kg  BMI:  Body mass index is 26.22 kg/m.  Estimated Nutritional Needs:  Kcal:  1840 Protein:  130-145 g (1.8-2 g/kg ibw) Fluid:  Per MD  EDUCATION NEEDS:  No education needs identified at this time  Burtis Junes RD, LDN, [place] Clinical Nutrition Pager: [phone] 02/15/2016 10:11 AM

## 2016-02-15 NOTE — Progress Notes (Signed)
Patient ID: Samantha Pittman, female   DOB: Jul 04, 1975, 40 y.o.   MRN: [id number]  [facility] Progress Note   Assessment/ Plan:   1. Acute renal failure: Injury suspected to be hemodynamically mediated with ischemic ATN. Fair UOP but without signs of renal recovery- rising potassium and BUN indicating need for HD today. No significant volume excess noted on exam. Will keep fluid even on dialysis today. Given catabolic rate- will plan for daily dialysis for clearance.  2. Malignant hypertension: Blood pressures borderline elevated, continue to monitor on when necessary hydralazine. 3. Anemia/thrombocytopenia:no evidence of TTP/HUS but may have DIC associated with malignancy. Platelet counts rising and with stable hemoglobin.  4. Left breast mass: Status post biopsy of breast mass yesterday-pathology results pending. 5. Seizure disorder/altered mentation:  MRI of the brain raising suspicion for posterior reversible encephalopathy syndrome-imaging also suggestive of metabolic encephalopathy. Subjective:   No acute events noted overnight, good UOP overnight.    Objective:   BP 130/85   Pulse (!) 118   Temp 98.5 F (36.9 C) (Oral)   Resp 12   Wt 73.7 kg (162 lb 7.7 oz)   LMP  (LMP Unknown)   SpO2 100%   BMI 26.22 kg/m   Intake/Output Summary (Last 24 hours) at 02/15/16 [id number] Last data filed at 02/15/16 0600  Gross per 24 hour  Intake              440 ml  Output             1640 ml  Net            -1200 ml   Weight change: -2.459 kg (-5 lb 6.7 oz)  Physical Exam: [id number], sedated CVS: Regular tachycardia, S1 and S2 with ejection systolic murmur Resp: Clear to auscultation bilaterally, no rales Abd: Soft, flat, nontender Ext: Trace lower extremity edema  Imaging: [place] Core Biopsy  Result Date: 02/13/2016 INDICATION: Patient admitted with multi system organ failure found to have a fungating mass within the medial aspect the left breast. Request made for  ultrasound-guided breast biopsy for tissue diagnostic purposes. EXAM: ULTRASOUND GUIDED BREAST BIOPSY COMPARISON:  None. MEDICATIONS: None ANESTHESIA/SEDATION: None, the patient is currently intubated and sedated in the ICU. COMPLICATIONS: None immediate. PROCEDURE: Informed written consent was obtained from the patient's family after a discussion of the risks, benefits and alternatives to treatment. The patient understands and consents the procedure. A timeout was performed prior to the initiation of the procedure. Note, this procedure was performed in the medical ICU given patient's medical fragility. Ultrasound scanning was performed of the palpable area of concern involving the medial aspect of the left breast demonstrating a large (at least 2.8 x 2.3 cm) hypoechoic solid pass within the 9 o'clock position of the left breast, approximately 5 cm from the nipple. The procedure was planned. The medial aspect the left breast was prepped and draped in the usual sterile fashion. The overlying soft tissues were anesthetized with 1% lidocaine with epinephrine. An 18 gauge core needle biopsy device was a utilized to obtain 6 core needle biopsies. Multiple ultrasound images were saved for procedural documentation purposes. Superficial hemostasis was obtained with manual compression. Post procedural scanning was negative for definitive area of hemorrhage. A dressing was placed. The patient tolerated the procedure well without immediate post procedural complication. IMPRESSION: Technically successful ultrasound guided of palpable mass within medial aspect of the left breast. Note, a biopsy clip was not left in place as this procedure was performed at  the bedside in the ICU to help aid the patient's acute management. Would recommend a formal diagnostic workup following the resolution of the patient's acute medical comorbidities. Electronically Signed   By: Sandi Mariscal M.D.   On: 02/13/2016 17:00   Dg Chest Port 1  View  Result Date: 02/15/2016 CLINICAL DATA:  Ventilator dependence. EXAM: PORTABLE CHEST 1 VIEW COMPARISON:  02/14/2016 FINDINGS: Endotracheal tube remains in place with tip 6.5 cm above the carina. Right jugular catheter terminates over the lower SVC, unchanged. Enteric tube terminates over the distal esophagus, similar to prior. Evaluation of mediastinal contours is limited by patient rotation. Increased soft tissue density in the right paratracheal region is unchanged as described on yesterday's examination. Cardiac silhouette is within normal limits for size. No airspace consolidation, edema, pleural effusion, or pneumothorax is identified. IMPRESSION: 1. Unchanged appearance of the chest.  Clear lungs. 2. Enteric tube in the distal esophagus.  Suggest advancing. Electronically Signed   By: Logan Bores M.D.   On: 02/15/2016 07:21   Dg Chest Port 1 View  Result Date: 02/14/2016 CLINICAL DATA:  Hypoxia with central catheter placement EXAM: PORTABLE CHEST 1 VIEW COMPARISON:  February 14, 2016 FINDINGS: Central catheter tip is in the superior vena cava. Endotracheal tube tip is 5.3 cm above the carina. Nasogastric tube tip is at the gastroesophageal junction with the side port above the gastroesophageal junction. No pneumothorax. There is no edema or consolidation. Heart size and pulmonary vascular normal. Soft tissue fullness in the right peritracheal region is stable and concerning for potential right paratracheal region adenopathy. No bone lesions. IMPRESSION: Tube and catheter positions as described without pneumothorax. Note that the nasogastric tube tip is at the gastroesophageal junction with the side port above the gastroesophageal junction. Advise advancing nasogastric tube approximately 10 cm to insure that both nasogastric tube tip and side port are well within the stomach. Soft tissue fullness the right paratracheal region of uncertain etiology. It may be prudent to consider contrast enhanced  chest CT to exclude possible adenopathy in this area. These results will be called to the ordering clinician or representative by the Radiologist Assistant, and communication documented in the PACS or zVision Dashboard. Electronically Signed   By: Lowella Grip III M.D.   On: 02/14/2016 16:25   Dg Chest Port 1 View  Result Date: 02/14/2016 CLINICAL DATA:  Hypoxia EXAM: PORTABLE CHEST 1 VIEW COMPARISON:  February 12, 2016 FINDINGS: Endotracheal tube tip is 5.6 cm above the carina. Nasogastric tube tip is at the gastroesophageal junction with the side port above the gastroesophageal junction. No pneumothorax. There is mild right base atelectasis. Lungs elsewhere clear. Heart size and pulmonary vascularity are normal. No adenopathy. No bone lesions. IMPRESSION: Tube positions as described without pneumothorax. Note that the nasogastric tube tip is at the gastroesophageal junction. Advise advancing nasogastric tube approximately 10 cm to insure that the nasogastric tube tip and side port are well within the stomach. There is right base atelectasis. Lungs elsewhere clear. Stable cardiac silhouette. These results will be called to the ordering clinician or representative by the Radiologist Assistant, and communication documented in the PACS or zVision Dashboard. Electronically Signed   By: Lowella Grip III M.D.   On: 02/14/2016 07:06    Labs: BMET  Recent Labs Lab 02/01/2016 1711 02/12/16 0444 02/13/16 0215 02/13/16 1711 02/14/16 0238 02/15/16 0229  NA 138 138 138 140 139 141  K 4.1 4.8 5.2* 5.3* 5.3* 5.7*  CL 104 107 105 110 107 105  CO2 20* 18* 18* 15* 16* 16*  GLUCOSE 136* 115* 131* 112* 108* 109*  BUN 56* 64* 85* 103* 110* 146*  CREATININE 6.47* 7.13* 8.60* 9.54* 9.56* 11.56*  CALCIUM 8.3* 8.4* 8.5* 8.2* 8.8* 8.7*  PHOS 6.7* 6.9* 8.6*  --  10.6* PENDING   CBC  Recent Labs Lab 02/06/2016 1711 02/12/16 0444 02/13/16 0215 02/14/16 0238 02/15/16 0229  WBC 16.8* 13.5* 11.2* 10.7*  13.3*  NEUTROABS 15.3*  --  10.0* 9.6* 11.8*  HGB 8.2* 7.7* 7.6* 7.3* 7.8*  HCT 24.7* 22.9* 22.4* 22.1* 23.9*  MCV 88.8 90.2 88.5 88.4 88.8  PLT 133*  130* 138* 198 238 325    Medications:    . chlorhexidine  15 mL Mouth Rinse BID  . famotidine (PEPCID) IV  20 mg Intravenous Q24H  . mouth rinse  15 mL Mouth Rinse 10 times per day  . OXcarbazepine  900 mg Per Tube BID  . piperacillin-tazobactam (ZOSYN)  IV  2.25 g Intravenous Q6H  . sodium bicarbonate  650 mg Per NG tube TID   Elmarie Shiley, MD 02/15/2016, 7:27 AM

## 2016-02-16 ENCOUNTER — Inpatient Hospital Stay (HOSPITAL_COMMUNITY): Payer: Medicaid Other

## 2016-02-16 DIAGNOSIS — R293 Abnormal posture: Secondary | ICD-10-CM

## 2016-02-16 DIAGNOSIS — C50919 Malignant neoplasm of unspecified site of unspecified female breast: Secondary | ICD-10-CM

## 2016-02-16 DIAGNOSIS — R4020 Unspecified coma: Secondary | ICD-10-CM

## 2016-02-16 DIAGNOSIS — R509 Fever, unspecified: Secondary | ICD-10-CM

## 2016-02-16 DIAGNOSIS — Z9889 Other specified postprocedural states: Secondary | ICD-10-CM

## 2016-02-16 DIAGNOSIS — C799 Secondary malignant neoplasm of unspecified site: Secondary | ICD-10-CM

## 2016-02-16 DIAGNOSIS — J9690 Respiratory failure, unspecified, unspecified whether with hypoxia or hypercapnia: Secondary | ICD-10-CM

## 2016-02-16 DIAGNOSIS — Z992 Dependence on renal dialysis: Secondary | ICD-10-CM

## 2016-02-16 DIAGNOSIS — I6340 Cerebral infarction due to embolism of unspecified cerebral artery: Secondary | ICD-10-CM | POA: Insufficient documentation

## 2016-02-16 DIAGNOSIS — R7881 Bacteremia: Secondary | ICD-10-CM

## 2016-02-16 DIAGNOSIS — J9601 Acute respiratory failure with hypoxia: Secondary | ICD-10-CM

## 2016-02-16 DIAGNOSIS — Z8673 Personal history of transient ischemic attack (TIA), and cerebral infarction without residual deficits: Secondary | ICD-10-CM

## 2016-02-16 DIAGNOSIS — S21009D Unspecified open wound of unspecified breast, subsequent encounter: Secondary | ICD-10-CM

## 2016-02-16 DIAGNOSIS — X58XXXD Exposure to other specified factors, subsequent encounter: Secondary | ICD-10-CM

## 2016-02-16 LAB — RENAL FUNCTION PANEL
ALBUMIN: 2.1 g/dL — AB (ref 3.5–5.0)
ANION GAP: 16 — AB (ref 5–15)
BUN: 102 mg/dL — ABNORMAL HIGH (ref 6–20)
CALCIUM: 8 mg/dL — AB (ref 8.9–10.3)
CO2: 26 mmol/L (ref 22–32)
Chloride: 97 mmol/L — ABNORMAL LOW (ref 101–111)
Creatinine, Ser: 7.5 mg/dL — ABNORMAL HIGH (ref 0.44–1.00)
GFR calc non Af Amer: 6 mL/min — ABNORMAL LOW (ref >60)
GFR, EST AFRICAN AMERICAN: 7 mL/min — AB (ref >60)
GLUCOSE: 133 mg/dL — AB (ref 65–99)
PHOSPHORUS: 8.4 mg/dL — AB (ref 2.5–4.6)
POTASSIUM: 4 mmol/L (ref 3.5–5.1)
SODIUM: 139 mmol/L (ref 135–145)

## 2016-02-16 LAB — CBC WITH DIFFERENTIAL/PLATELET
BASOS ABS: 0 10*3/uL (ref 0.0–0.1)
BASOS PCT: 0 %
Eosinophils Absolute: 0 10*3/uL (ref 0.0–0.7)
Eosinophils Relative: 0 %
HEMATOCRIT: 20.2 % — AB (ref 36.0–46.0)
HEMOGLOBIN: 6.7 g/dL — AB (ref 12.0–15.0)
LYMPHS PCT: 5 %
Lymphs Abs: 0.7 10*3/uL (ref 0.7–4.0)
MCH: 28.8 pg (ref 26.0–34.0)
MCHC: 33.2 g/dL (ref 30.0–36.0)
MCV: 86.7 fL (ref 78.0–100.0)
MONO ABS: 0.7 10*3/uL (ref 0.1–1.0)
Monocytes Relative: 5 %
NEUTROS ABS: 12.3 10*3/uL — AB (ref 1.7–7.7)
NEUTROS PCT: 89 %
Platelets: 357 10*3/uL (ref 150–400)
RBC: 2.33 MIL/uL — AB (ref 3.87–5.11)
RDW: 16.2 % — ABNORMAL HIGH (ref 11.5–15.5)
WBC: 13.8 10*3/uL — ABNORMAL HIGH (ref 4.0–10.5)

## 2016-02-16 LAB — HIV ANTIBODY (ROUTINE TESTING W REFLEX): HIV Screen 4th Generation wRfx: NONREACTIVE

## 2016-02-16 LAB — GLUCOSE, CAPILLARY
GLUCOSE-CAPILLARY: 113 mg/dL — AB (ref 65–99)
Glucose-Capillary: 109 mg/dL — ABNORMAL HIGH (ref 65–99)
Glucose-Capillary: 135 mg/dL — ABNORMAL HIGH (ref 65–99)

## 2016-02-16 LAB — FIBRINOGEN

## 2016-02-16 LAB — APTT: APTT: 39 s — AB (ref 24–36)

## 2016-02-16 LAB — HEPATITIS B SURFACE ANTIGEN: HEP B S AG: NEGATIVE

## 2016-02-16 LAB — MAGNESIUM
MAGNESIUM: 2.8 mg/dL — AB (ref 1.7–2.4)
MAGNESIUM: 2.3 mg/dL (ref 1.7–2.4)

## 2016-02-16 LAB — PREPARE RBC (CROSSMATCH)

## 2016-02-16 LAB — HEPATITIS B SURFACE ANTIBODY,QUALITATIVE: Hep B S Ab: NONREACTIVE

## 2016-02-16 LAB — HEMOGLOBIN AND HEMATOCRIT, BLOOD
HCT: 24.8 % — ABNORMAL LOW (ref 36.0–46.0)
Hemoglobin: 8.3 g/dL — ABNORMAL LOW (ref 12.0–15.0)

## 2016-02-16 LAB — PROTIME-INR
INR: 1.35
Prothrombin Time: 16.8 seconds — ABNORMAL HIGH (ref 11.4–15.2)

## 2016-02-16 LAB — HEPATITIS B CORE ANTIBODY, TOTAL: Hep B Core Total Ab: NEGATIVE

## 2016-02-16 MED ORDER — HEPARIN SODIUM (PORCINE) 1000 UNIT/ML DIALYSIS
40.0000 [IU]/kg | INTRAMUSCULAR | Status: DC | PRN
  Administered 2016-02-16: 2900 [IU] via INTRAVENOUS_CENTRAL

## 2016-02-16 MED ORDER — AMOXICILLIN-POT CLAVULANATE 400-57 MG/5ML PO SUSR
500.0000 mg | ORAL | Status: AC
  Administered 2016-02-16 – 2016-02-20 (×5): 504 mg via ORAL
  Filled 2016-02-16 (×5): qty 6.3

## 2016-02-16 MED ORDER — SODIUM CHLORIDE 0.9 % IV SOLN: Freq: Once | INTRAVENOUS | Status: DC

## 2016-02-16 NOTE — Progress Notes (Signed)
PULMONARY / CRITICAL CARE MEDICINE   Name: Samantha Pittman MRN: [id number] DOB: [birth year]    ADMISSION DATE:  01/29/2016 CONSULTATION DATE:  01/17/2016  REFERRING MD:  Dr, Langley Gauss / [facility]   CHIEF COMPLAINT:  Weakness / Dizziness   BRIEF:  40 y/o F with PMH of tubal ligation, HTN,seizures who presented to [facility] on 10/30 with a 6 day history of weakness, dizziness and difficulty seeing out of her left eye.    Initial evaluation found the patient to be significantly hypertensive with presenting pressure of 224/160 (MAP 81).  Initial labs Na 135, K 3.6, Cl 93, AG 24, BUN 36, sr cr 2.94, glucose 184, mag 2.5, AST 31/ALT 14, alk phos 136, TSH 0.37, WBC 11.3, Hgb 11, and platelets 51.  UA was negative.  She was treated with captopril, ativan, amlodipine and labetalol to reduce blood pressure.  The patient was admitted to [facility] for further evaluation.  She later had a seizure.  Per report, she was felt to be post ictal while on the medical floor.  Am of 10/31, she remained altered and pupils were unequal.  Follow up CT of the head was obtained which demonstrated an abnormal new hypodensity in the head of the right caudate nucleus extending into the right lentiform nucleus, acute or early subacute infarct in this vicinity.  The patient was intubated and transferred to [facility] for further evaluation.        LINES/TUBES: OETT 10/31 >>  OGT 10/31 >> FOLEY 10/31 >> PIV x2  SIGNIFICANT EVENTS: 10/30 - Admit to [facility] with weakness, dizziness.  Hypertensive emergency.  Seizure > AMS,  10/31 - AMS continued, CT head worrisome for CVA, transferred to [facility]. EEG 10/31:  Mild generalized nonspecific continuous slowing of cerebral activity which can be seen with sedating medications as well as metabolic and toxic encephalopathies. No evidence of epileptiform activity was recorded.  11/1: MRI findings perhaps an usual appearance of Severe Posterior Reversible  Encephalopathy Syndrome.  11/2: Sepsis protocol initiated for fevers, elevated WBC, and elevated procalcitonin. ECHO with EF 65-70% and mildly increased pulmonary artery systolic pressure. BIopsy left breast mass 02/14/16 - Worsening of Cr and Oliguria. Lasix given. Still very obtunded. Per RN, only grimaces with oral care otherwise not very responsive. Start HD  02/15/16 - Making urine. Renal s/p HD . Remains unresponsive - doing PSV. 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 and growing strep viridan from blood culture 02/12/16  - ID suspects contaminant   SUBJECTIVE/OVERNIGHT/INTERVAL HX 02/16/16 - RN and RT confirmed about increased decerebrate posturing. S/p HD today. Still with cough, gag and ability to trigger vent. Did get 1 unit prbc.   VITAL SIGNS: BP (!) 129/95 (BP Location: Right Arm)   Pulse (!) 116   Temp 98.7 F (37.1 C) (Axillary)   Resp 16   Wt 73.5 kg (162 lb 0.6 oz)   LMP  (LMP Unknown)   SpO2 100%   BMI 26.15 kg/m   HEMODYNAMICS:    VENTILATOR SETTINGS: Vent Mode: CPAP;PSV FiO2 (%):  [40 %] 40 % Set Rate:  [12 bmp] 12 bmp Vt Set:  [400 mL] 400 mL PEEP:  [5 cmH20] 5 cmH20 Pressure Support:  [10 cmH20] 10 cmH20 Plateau Pressure:  [15 cmH20-16 cmH20] 15 cmH20  INTAKE / OUTPUT: I/O last 3 completed shifts: In: 1620 [I.V.:330; NG/GT:990; IV Piggyback:300] Out: 1870 [HJSCB:8377; Other:500]  PHYSICAL EXAMINATION: General:  Critically ill looking on vent Integument:  Warm & dry. No rash on exposed skin. No bruising. HEENT:  No scleral injection or icterus. Pupils reactive but sluggish. Endotracheal tube in place.  Cardiovascular:  Regular rate. No edema. No appreciable JVD.  Pulmonary:  Clear bilaterally to auscultation. Symmetric chest wall rise on ventilator. Abdomen: Soft. Normal bowel sounds. Nondistended.  Musculoskeletal:  No joint deformity or effusion appreciated. Neurological:  No withdrawal to pain in  extremities. No spontaneous movements. No response to voice. RASS -5. GAG +, PEER L + . Doing PSV +  . New posturing noted 02/16/16  LABS:  PULMONARY  Recent Labs Lab 02/09/2016 1625 02/13/16 1955  PHART 7.392 7.323*  PCO2ART 29.6* 32.1  PO2ART 169* 83.0  HCO3 17.6* 16.7*  TCO2  --  18  O2SAT 99.4 96.0    CBC  Recent Labs Lab 02/15/16 0229 02/15/16 1430 02/16/16 0511  HGB 7.8* 7.4* 6.7*  HCT 23.9* 22.4* 20.2*  WBC 13.3* 15.3* 13.8*  PLT 325 338 357    COAGULATION  Recent Labs Lab 02/01/2016 1711 02/13/16 0215 02/14/16 0238 02/15/16 0229 02/16/16 0511  INR 1.17  1.22 1.38 1.27 1.54 1.35    CARDIAC    Recent Labs Lab 02/04/2016 2225 02/12/16 0444 02/12/16 1034 02/12/16 1701 02/12/16 2215  TROPONINI 1.88* 2.04* 1.97* 1.35* 2.23*   No results for input(s): PROBNP in the last 168 hours.   CHEMISTRY  Recent Labs Lab 02/12/16 0444 02/13/16 0215 02/13/16 1711 02/14/16 0238 02/15/16 0229 02/15/16 1025 02/15/16 1430 02/16/16 0511  NA 138 138 140 139 141  --   --  139  K 4.8 5.2* 5.3* 5.3* 5.7*  --   --  4.0  CL 107 105 110 107 105  --   --  97*  CO2 18* 18* 15* 16* 16*  --   --  26  GLUCOSE 115* 131* 112* 108* 109*  --   --  133*  BUN 64* 85* 103* 110* 146*  --   --  102*  CREATININE 7.13* 8.60* 9.54* 9.56* 11.56*  --   --  7.50*  CALCIUM 8.4* 8.5* 8.2* 8.8* 8.7*  --   --  8.0*  MG 2.3 2.3  --  2.6* 2.9* 3.0* 3.1* 2.8*  PHOS 6.9* 8.6*  --  10.6* 13.2*  --   --  8.4*   CrCl cannot be calculated (Unknown ideal weight.).   LIVER  Recent Labs Lab 02/08/2016 1711 02/13/16 0215 02/13/16 1711 02/14/16 0238 02/15/16 0229 02/16/16 0511  AST 29  --  210*  --  33  --   ALT 18  --  271*  --  178*  --   ALKPHOS 100  --  115  --  114  --   BILITOT 0.4  --  0.6  --  0.8  --   PROT 6.3*  --  5.5*  --  6.8  --   ALBUMIN 3.1* 2.5* 2.4* 2.5* 2.5*  2.5* 2.1*  INR 1.17  1.22 1.38  --  1.27 1.54 1.35     INFECTIOUS  Recent Labs Lab 02/12/16 1825  02/13/16 0215 02/13/16 1711 02/14/16 0238  LATICACIDVEN  --   --  1.0  --   PROCALCITON 0.73 8.77  --  18.68     ENDOCRINE CBG (last 3)   Recent Labs  02/15/16 2355 02/16/16 0339  GLUCAP 135* 113*         IMAGING x48h  - image(s) personally visualized  -   highlighted in bold Dg Chest  Port 1 View  Result Date: 02/16/2016 CLINICAL DATA:  Check endotracheal tube EXAM: PORTABLE CHEST 1 VIEW COMPARISON:  02/15/2016 FINDINGS: Endotracheal tube, nasogastric catheter and right central venous line are again seen. The nasogastric catheter has been advanced slightly and the tip now lies within the stomach. Proximal side port still lies within the distal esophagus. The lungs are well aerated bilaterally. No focal infiltrate is seen. No bony abnormality is noted. IMPRESSION: Slight advancement of nasogastric catheter when compare with the prior exam. No new focal abnormality is seen. Electronically Signed   By: Inez Catalina M.D.   On: 02/16/2016 07:16   Dg Chest Port 1 View  Result Date: 02/15/2016 CLINICAL DATA:  Check endotracheal tube placement EXAM: PORTABLE CHEST 1 VIEW COMPARISON:  02/15/2016 FINDINGS: Temporary dialysis catheter is noted in the right jugular vein in satisfactory position. Endotracheal tube has been advanced slightly and now lies approximately 3.8 cm above the carina. Nasogastric catheter extends into the distal esophagus but does not cross the gastroesophageal junction. This could be advanced. The lungs are well aerated bilaterally. No focal infiltrate is seen. Stable right peritracheal density is noted. IMPRESSION: Tubes and lines as described above. The nasogastric catheter remains in the distal esophagus and should be advanced. The remainder of the exam is stable from the prior study. Electronically Signed   By: Inez Catalina M.D.   On: 02/15/2016 10:46   Dg Chest Port 1 View  Result Date: 02/15/2016 CLINICAL DATA:  Ventilator dependence. EXAM: PORTABLE CHEST 1 VIEW  COMPARISON:  02/14/2016 FINDINGS: Endotracheal tube remains in place with tip 6.5 cm above the carina. Right jugular catheter terminates over the lower SVC, unchanged. Enteric tube terminates over the distal esophagus, similar to prior. Evaluation of mediastinal contours is limited by patient rotation. Increased soft tissue density in the right paratracheal region is unchanged as described on yesterday's examination. Cardiac silhouette is within normal limits for size. No airspace consolidation, edema, pleural effusion, or pneumothorax is identified. IMPRESSION: 1. Unchanged appearance of the chest.  Clear lungs. 2. Enteric tube in the distal esophagus.  Suggest advancing. Electronically Signed   By: Logan Bores M.D.   On: 02/15/2016 07:21   Dg Chest Port 1 View  Result Date: 02/14/2016 CLINICAL DATA:  Hypoxia with central catheter placement EXAM: PORTABLE CHEST 1 VIEW COMPARISON:  February 14, 2016 FINDINGS: Central catheter tip is in the superior vena cava. Endotracheal tube tip is 5.3 cm above the carina. Nasogastric tube tip is at the gastroesophageal junction with the side port above the gastroesophageal junction. No pneumothorax. There is no edema or consolidation. Heart size and pulmonary vascular normal. Soft tissue fullness in the right peritracheal region is stable and concerning for potential right paratracheal region adenopathy. No bone lesions. IMPRESSION: Tube and catheter positions as described without pneumothorax. Note that the nasogastric tube tip is at the gastroesophageal junction with the side port above the gastroesophageal junction. Advise advancing nasogastric tube approximately 10 cm to insure that both nasogastric tube tip and side port are well within the stomach. Soft tissue fullness the right paratracheal region of uncertain etiology. It may be prudent to consider contrast enhanced chest CT to exclude possible adenopathy in this area. These results will be called to the ordering  clinician or representative by the Radiologist Assistant, and communication documented in the PACS or zVision Dashboard. Electronically Signed   By: Lowella Grip III M.D.   On: 02/14/2016 16:25       ASSESSMENT / PLAN:  NEUROLOGIC A:   Acute Encephalopathy - Unclear etiology. CVA vs PRES vs TTP. 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 MRI with extensive edema without hemorrhage. Superimposed scattered acute infarcts.  Seizure - Occurred w/ correction of hypertensive urgency/emergency. H/O Seizure Disorder   02/16/16 - still unresponsive/comatose . Possible decerebrate posturing   P:   STAT head Ct - Dr Belenda Cruise from stroke svc will foloo RASS goal: 0  Neurology Consulted & following Seizure Precautions AEDs per Neurology:  Trileptal avopid benzo and opioids   CARDIOVASCULAR A:  Hypertensive Emergency - Initially.  Hypertension  Elevated Troponin I - Likely subendocardial ischemia. H/O HTN - No medications as an outpatient.   = normotensive currently  P:  Continuous Telemetry Monitoring Vitals per Unit Protocol Goal SBP >95 Hydralazine prn    RENAL A:   Acute Renal Failure - Most likely ischemic ATN. /multifactorial - sepsis, ACe inhibitor, high bp    -02/15/16 -HD first - 02/16/16 - got HD again. 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 Prob due to sepsis Thrombocytopenia   - Mild and improving. Schistocytes on smear. Possible TTP entertained at admit  with altered mental status but considered doubtful by heme   P:  Trending Cell Counts daily w/ CBC Trending Coags daily SCDs    PULMONARY A: Acute Respiratory Failure - Unable to protect airway with altered mental status.   P:   Full Vent Support Intermittent ABG & Portable  CXR Weaning FiO2 for Sat >92% Albuterol neb prn  GASTROINTESTINAL A:   No acute issues.  P:   NPO Pepcid IV q24hr  Tube feedings   INFECTIOUS  MICROBIOLOGY: MRSA PCR 10/31:  Negative Blood Cx 11/1: Strep viridans  Breast Wound Culture: Pending  Hep panel pending 02/15/16   A:   STrep viridans bacteremia  - from admit 02/12/2016. ID suspectc contaminant  P:   abx per ID   ENDOCRINE A:   No acute issues.   P:   Monitor glucose with daily labs.   FAMILY  - Updates: No family at bedside  02/15/16 , 02/16/16    The patient is critically ill with multiple organ systems failure and requires high complexity decision making for assessment and support, frequent evaluation and titration of therapies, application of advanced monitoring technologies and extensive interpretation of multiple databases.   Critical Care Time devoted to patient care services described in this note is  30  Minutes. This time reflects time of care of this signee Dr Brand Males. This critical care time does not reflect procedure time, or teaching time or supervisory time of PA/NP/Med student/Med Resident etc but could involve care discussion time    Dr. Brand Males, M.D., [facility].C.P Pulmonary and Critical Care Medicine Staff Physician [place] [facility] Pager: [phone], If no answer or between  15:00h - 7:00h: call [phone]  02/16/2016 12:07 PM

## 2016-02-16 NOTE — Progress Notes (Signed)
STROKE TEAM PROGRESS NOTE   HISTORY OF PRESENT ILLNESS (per record) Samantha Pittman is an 40 yo female with hx of seizures and HTN presented to outside hospital with hypertensive urgency. BP was lowered quickly and had a seizure. 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 She was intubated for airway protection and transferred to [facility] for further evaluation.  Patient was not administered IV t-PA. She was admitted to the neuro ICU for further evaluation and treatment.   SUBJECTIVE (INTERVAL HISTORY) Patient remains intubated and obtunded and unresponsive. Blood pressure remains well controlled. She has had no further documented seizures.  OBJECTIVE Temp:  [98 F (36.7 C)-101 F (38.3 C)] 101 F (38.3 C) (11/05 0344) Pulse Rate:  [106-127] 121 (11/05 0700) Cardiac Rhythm: Sinus tachycardia (11/05 0403) Resp:  [12-27] 17 (11/05 0700) BP: (103-161)/(68-102) 121/78 (11/05 0700) SpO2:  [95 %-100 %] 100 % (11/05 0700) FiO2 (%):  [40 %] 40 % (11/05 0600) Weight:  [73.5 kg (162 lb 0.6 oz)-77 kg (169 lb 12.1 oz)] 73.5 kg (162 lb 0.6 oz) (11/05 0530)  CBC:   Recent Labs Lab 02/15/16 0229 02/15/16 1430 02/16/16 0511  WBC 13.3* 15.3* 13.8*  NEUTROABS 11.8*  --  12.3*  HGB 7.8* 7.4* 6.7*  HCT 23.9* 22.4* 20.2*  MCV 88.8 89.2 86.7  PLT 325 338 [id number]    Basic Metabolic Panel:   Recent Labs Lab 02/15/16 0229  02/15/16 1430 02/16/16 0511  NA 141  --   --  139  K 5.7*  --   --  4.0  CL 105  --   --  97*  CO2 16*  --   --  26  GLUCOSE 109*  --   --  133*  BUN 146*  --   --  102*  CREATININE 11.56*  --   --  7.50*  CALCIUM 8.7*  --   --  8.0*  MG 2.9*  < > 3.1* 2.8*  PHOS 13.2*  --   --  8.4*  < > = values in this interval not displayed.  Lipid Panel:     Component Value Date/Time   CHOL 183 02/13/2016 0215   TRIG 83 02/13/2016 0215   HDL 66 02/13/2016 0215   CHOLHDL 2.8 02/13/2016  0215   VLDL 17 02/13/2016 0215   LDLCALC 100 (H) 02/13/2016 0215   HgbA1c:  Lab Results  Component Value Date   HGBA1C 4.7 (L) 02/13/2016   Urine Drug Screen:     Component Value Date/Time   LABOPIA NONE DETECTED 02/12/2016 1319   COCAINSCRNUR NONE DETECTED 02/12/2016 1319   LABBENZ NONE DETECTED 02/12/2016 1319   AMPHETMU NONE DETECTED 02/12/2016 1319   THCU NONE DETECTED 02/12/2016 1319   LABBARB NONE DETECTED 02/12/2016 1319    IMAGING  [place] Core Biopsy 02/13/2016 Technically successful ultrasound guided of palpable mass within medial aspect of the left breast.  Note, a biopsy clip was not left in place as this procedure was performed at the bedside in the ICU to help aid the patient's acute management.  Would recommend a formal diagnostic workup following the resolution of the patient's acute medical comorbidities.   Dg Chest Port 1 View 02/15/2016 1. Unchanged appearance of the chest.  Clear lungs.  2. Enteric tube in the distal esophagus.  Suggest advancing.   Dg Chest Port 1 View 02/14/2016 Tube and catheter positions as described  without pneumothorax. Note that the nasogastric tube tip is at the gastroesophageal junction with the side port above the gastroesophageal junction. Advise advancing nasogastric tube approximately 10 cm to insure that both nasogastric tube tip and side port are well within the stomach.  Soft tissue fullness the right paratracheal region of uncertain etiology. It may be prudent to consider contrast enhanced chest CT to exclude possible adenopathy in this area.    Dg Chest Port 1 View 02/14/2016 Tube positions as described without pneumothorax. Note that the nasogastric tube tip is at the gastroesophageal junction. Advise advancing nasogastric tube approximately 10 cm to insure that the nasogastric tube tip and side port are well within the stomach. There is right base atelectasis. Lungs elsewhere clear. Stable cardiac silhouette.    MRI/MRA   02/12/2016 1. Extensive edema affecting the bilateral pons and medulla with associated brainstem swelling. No associated hemorrhage. 2. 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 No associated hemorrhage or mass effect. 3. Intracranial MRA is negative for emergent large vessel occlusion. There is at most mild irregularity of the bilateral PCAs and the right ACA.    EEG  This EEG is abnormal with mild generalized nonspecific continuous slowing of cerebral activity which can be seen with sedating medications as well as metabolic and toxic encephalopathies. No evidence of epileptiform activity was recorded.  CUS Could not obtain images from right carotid due to poor patient position with head turned completely to the right. Tried to reposition patient multiple times but she kept head firmly to the right. Left :No significant (1-39%) ICA stenosis. Antegrade vertebral flow.  Echocardiogram 02/13/2016 Left ventricle:  The cavity size was normal. There was moderate concentric hypertrophy.  Systolic function was vigorous. The estimated ejection fraction was in the range of 65% to 70%.  Wall motion was normal; there were no regional wall motion  PHYSICAL EXAM Middle aged african [place] lady who is intubated. Not on sedation . Afebrile. Head is nontraumatic. Neck is supple without bruit.    Cardiac exam no murmur or gallop. Lungs are clear to auscultation. Distal pulses are well felt.  Neurological Exam:   Intubated. Unresponsive to noxious stimuli.  Does not follow any commands.   Right pupil is irregular not reactive. Left pupil 2 mm sluggishly reactive. Right eye in downward position at rest.  Left eye midline at rest.  OCRs are absent.  Corneal reflexes are present bilaterally. Patient has a weak gag.  She does not grimace to pain. There is extension in the  right upper extremity and no response in the left upper extremity to pain.  There is minimal triple flexion in the lower extremities bilaterally.  Plantars are downgoing.   ASSESSMENT/PLAN [name] is a 40 y.o. female with history of HTN and seizures presenting to outlying hospital with hypertensive emergency followed by seizure. She did not present with stroke symptoms. CT shows a stroke, age indeterminate.  Comatose state with poor neurological exam secondary to extensive brainstem edema and white matter lesions consistent with hypertensive encephalopathy. Superimposed acute infarcts in both cerebral hemispheres etiology unclear whether related to breast or other. Relatively sudden presentation following a GI illness with now fever and elevated white count raise concern for sepsis. Etiology of renal failure likely related to hypovolemia and acute tubular necrosis.  Other stroke  CT at outlying hospital with R caudate lacunar infarct  MRI - (see above) Edema  and scattered acute infarcts both cerebral hemispheres.  MRA - negative for emergent large vessel occlusion  Carotid Doppler  Left :No significant (1-39%) ICA stenosis. Antegrade vertebral flow. (Right ICA - see above)   2D Echo - EF 65-70%. No cardiac source of emboli identified.  LDL  100  HgbA1c 4.7  SCDs for VTE prophylaxis Diet NPO time specified  No antithrombotic prior to admission, received one dose of aspirin 324 mg yesterday. No further aspirin given likely TTP  Ongoing aggressive stroke risk factor management  Therapy recommendations:  Can not evaluate now  Disposition:  pending   Seizure   Not related to stroke seen on CT  EEG generalized nonspecific slowing  Continue trileptal 300 bid  EEG - see reading documented earlier in the hospital record  Respiratory failure  Intubated after seizure for airway protection  CCM attending  Hypertensive Emergency  Not on home medications  Stable  this am  Long-term BP goal normotensive  Other Active Problems  Acute kidney injury, Cr 11.56 possibly ATN. Potassium - 5.7  Unlikely TTP, Thrombocytopenia 130. 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 I independently viewed imaging studies, participated in medical decision making and plan of care. The laboratory and radiographic studies were personally reviewed by me.  ROS pertinent positives could not be fully documented due to LOC  Assessment and plan completed by me personally and fully documented above.  Bilateral embolic appearing ischemic infarcts  Co-morbidities  Renal failure  Severe anemia  Thrombocytopenia  Leukocytosis  BP remains well controlled.    Prognosis is concerning giving atypical appearance of the PRES and multiple co-morbidities.  Some MRI changes may in fact be ischemia.  Will continue to follow.  After rounds, called from unit regarding new decerebrate posturing not noted earlier in the morning.  Critical care informed that STAT head CT was ordered.  Will follow for results.  If there are no CT findings to explain change in exam, will assess the need for repeat EEG  Condition ?worsened   This patient is critically ill and at significant risk of neurological worsening, death and care requires constant monitoring of vital signs, hemodynamics,respiratory and cardiac monitoring, extensive review of multiple databases, frequent neurological assessment, discussion with family, other specialists and medical decision making of high complexity.  This critical care time does not reflect procedure time, or teaching time or supervisory time of PA/NP/Med Resident etc. but could involve care discussion time.  I spent 30 minutes of Neurocritical  Care time in the care of  this patient.  SIGNED BY: Dr. Elissa Hefty    To contact Stroke Continuity provider, please refer to [email or web address] After hours, contact General Neurology

## 2016-02-16 NOTE — Progress Notes (Signed)
eLink Physician-Brief Progress Note Patient Name: Suniya Haughey DOB: Sep 03, 1975 MRN: [id number]   Date of Service  02/16/2016  HPI/Events of Note  Anemia w/ Hgb now 6.7. Previously 7.4. Nurse reports some blood in her mouth possibly from a tongue laceration as well as some dried blood on the pad underneath her that could be menstrual in origin.   eICU Interventions  1. Type & screen stat 2. Transfuse 1u PRBC w/ dialsysis 3. Repeat Hgb/hct post transfusion     Intervention Category Intermediate Interventions: Other:  Tera Partridge 02/16/2016, 6:15 AM

## 2016-02-16 NOTE — Progress Notes (Signed)
Patient ID: Samantha Pittman, female   DOB: [birth year], 40 y.o.   MRN: [id number]  [facility] Progress Note   Assessment/ Plan:   1. Acute renal failure: Injury suspected to be hemodynamically mediated with ischemic ATN. Urine output in nonoliguric range (680 mL over the past 24 hours). Hemodialysis today for clearance to try and see if element of uremic encephalopathy can be alleviated to allow for accurate neurological assessment. Plan again for hemodialysis tomorrow with attempted ultrafiltration. On physical exam, she is mildly hypervolemic. 2. Malignant hypertension: Blood pressures currently appear to be fairly controlled without any scheduled antihypertensive therapy. 3. Anemia/thrombocytopenia: Appears to be associated with sepsis/TMA from accelerated hypertension-hemoglobin dropped today noted and plans for PRBC transfusion at dialysis.  4. 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 5. Seizure disorder/altered mentation:  MRI of the brain raising suspicion for posterior reversible encephalopathy syndrome-imaging also suggestive of metabolic encephalopathy. 6. Sepsis: With 1 out of 4 blood cultures positive for strep. Dense- source suspected to be from her fungating breast mass. On coverage with Zosyn and vancomycin. Remains febrile overnight. Subjective:   No acute events overnight-hemoglobin drop noted on labs this morning, plans for dialysis.    Objective:   BP 121/78   Pulse (!) 121   Temp (!) 101 F (38.3 C) (Oral)   Resp 17   Wt 73.5 kg (162 lb 0.6 oz)   LMP  (LMP Unknown)   SpO2 100%   BMI 26.15 kg/m   Intake/Output Summary (Last 24 hours) at 02/16/16 0732 Last data filed at 02/16/16 0700  Gross per 24 hour  Intake             1420 ml  Output             1180 ml  Net              240 ml   Weight change: 3.3 kg (7 lb 4.4 oz)  Physical Exam: [id number], sedated CVS:  Regular tachycardia, S1 and S2 with ejection systolic murmur Resp: Clear to auscultation bilaterally, no rales Abd: Soft, flat, nontender Ext: Trace-1+ lower extremity edema  Imaging: Dg Chest Port 1 View  Result Date: 02/16/2016 CLINICAL DATA:  Check endotracheal tube EXAM: PORTABLE CHEST 1 VIEW COMPARISON:  02/15/2016 FINDINGS: Endotracheal tube, nasogastric catheter and right central venous line are again seen. The nasogastric catheter has been advanced slightly and the tip now lies within the stomach. Proximal side port still lies within the distal esophagus. The lungs are well aerated bilaterally. No focal infiltrate is seen. No bony abnormality is noted. IMPRESSION: Slight advancement of nasogastric catheter when compare with the prior exam. No new focal abnormality is seen. Electronically Signed   By: Inez Catalina M.D.   On: 02/16/2016 07:16   Dg Chest Port 1 View  Result Date: 02/15/2016 CLINICAL DATA:  Check endotracheal tube placement EXAM: PORTABLE CHEST 1 VIEW COMPARISON:  02/15/2016 FINDINGS: Temporary dialysis catheter is noted in the right jugular vein in satisfactory position. Endotracheal tube has been advanced slightly and now lies approximately 3.8 cm above the carina. Nasogastric catheter extends into the distal esophagus but does not cross the gastroesophageal junction. This could be advanced. The lungs are well aerated bilaterally. No focal infiltrate is seen. Stable right peritracheal density is noted. IMPRESSION: Tubes and lines as described above. The nasogastric catheter remains in the distal esophagus and should be advanced. The remainder of the exam is  stable from the prior study. Electronically Signed   By: Inez Catalina M.D.   On: 02/15/2016 10:46   Dg Chest Port 1 View  Result Date: 02/15/2016 CLINICAL DATA:  Ventilator dependence. EXAM: PORTABLE CHEST 1 VIEW COMPARISON:  02/14/2016 FINDINGS: Endotracheal tube remains in place with tip 6.5 cm above the carina. Right  jugular catheter terminates over the lower SVC, unchanged. Enteric tube terminates over the distal esophagus, similar to prior. Evaluation of mediastinal contours is limited by patient rotation. Increased soft tissue density in the right paratracheal region is unchanged as described on yesterday's examination. Cardiac silhouette is within normal limits for size. No airspace consolidation, edema, pleural effusion, or pneumothorax is identified. IMPRESSION: 1. Unchanged appearance of the chest.  Clear lungs. 2. Enteric tube in the distal esophagus.  Suggest advancing. Electronically Signed   By: Logan Bores M.D.   On: 02/15/2016 07:21   Dg Chest Port 1 View  Result Date: 02/14/2016 CLINICAL DATA:  Hypoxia with central catheter placement EXAM: PORTABLE CHEST 1 VIEW COMPARISON:  February 14, 2016 FINDINGS: Central catheter tip is in the superior vena cava. Endotracheal tube tip is 5.3 cm above the carina. Nasogastric tube tip is at the gastroesophageal junction with the side port above the gastroesophageal junction. No pneumothorax. There is no edema or consolidation. Heart size and pulmonary vascular normal. Soft tissue fullness in the right peritracheal region is stable and concerning for potential right paratracheal region adenopathy. No bone lesions. IMPRESSION: Tube and catheter positions as described without pneumothorax. Note that the nasogastric tube tip is at the gastroesophageal junction with the side port above the gastroesophageal junction. Advise advancing nasogastric tube approximately 10 cm to insure that both nasogastric tube tip and side port are well within the stomach. Soft tissue fullness the right paratracheal region of uncertain etiology. It may be prudent to consider contrast enhanced chest CT to exclude possible adenopathy in this area. These results will be called to the ordering clinician or representative by the Radiologist Assistant, and communication documented in the PACS or zVision  Dashboard. Electronically Signed   By: Lowella Grip III M.D.   On: 02/14/2016 16:25    Labs: BMET  Recent Labs Lab 01/31/2016 1711 02/12/16 0444 02/13/16 0215 02/13/16 1711 02/14/16 0238 02/15/16 0229 02/16/16 0511  NA 138 138 138 140 139 141 139  K 4.1 4.8 5.2* 5.3* 5.3* 5.7* 4.0  CL 104 107 105 110 107 105 97*  CO2 20* 18* 18* 15* 16* 16* 26  GLUCOSE 136* 115* 131* 112* 108* 109* 133*  BUN 56* 64* 85* 103* 110* 146* 102*  CREATININE 6.47* 7.13* 8.60* 9.54* 9.56* 11.56* 7.50*  CALCIUM 8.3* 8.4* 8.5* 8.2* 8.8* 8.7* 8.0*  PHOS 6.7* 6.9* 8.6*  --  10.6* 13.2* 8.4*   CBC  Recent Labs Lab 02/13/16 0215 02/14/16 0238 02/15/16 0229 02/15/16 1430 02/16/16 0511  WBC 11.2* 10.7* 13.3* 15.3* 13.8*  NEUTROABS 10.0* 9.6* 11.8*  --  12.3*  HGB 7.6* 7.3* 7.8* 7.4* 6.7*  HCT 22.4* 22.1* 23.9* 22.4* 20.2*  MCV 88.5 88.4 88.8 89.2 86.7  PLT 198 238 325 338 357    Medications:    . sodium chloride   Intravenous Once  . chlorhexidine  15 mL Mouth Rinse BID  . famotidine (PEPCID) IV  20 mg Intravenous Q24H  . feeding supplement (NEPRO CARB STEADY)  1,000 mL Per Tube Q24H  . feeding supplement (PRO-STAT SUGAR FREE 64)  60 mL Per Tube TID  . mouth rinse  15 mL Mouth Rinse 10 times per day  . OXcarbazepine  900 mg Per Tube BID  . piperacillin-tazobactam  3.375 g Intravenous Q12H  . sodium bicarbonate  650 mg Per NG tube TID   Elmarie Shiley, MD 02/16/2016, 7:32 AM

## 2016-02-16 NOTE — Procedures (Signed)
Patient seen on Hemodialysis. QB 300, UF goal 0.5L (keeping even) Treatment adjusted as needed.  Samantha Shiley MD [facility] Office # [phone] Pager # [phone] 8:12 AM

## 2016-02-16 NOTE — Progress Notes (Addendum)
[place] for Infectious Disease    Date of Admission:  01/21/2016   Total days of antibiotics 4        Day 3 piptazo        Day 2 vanco           ID: Samantha Pittman is a 40 y.o. female with CVA/PRES, with respiratory distress requiring intubation with intermittent fevers and leukocytosis, with worsening AKI requiring HD Active Problems:   CVA (cerebral vascular accident) ([place])   PRES (posterior reversible encephalopathy syndrome)   Acute encephalopathy   Acute renal failure (ARF) ([facility])   Breast mass   Ventilator dependence ([facility])    Subjective: Had fever of 101F last night, tolerated 1st session of HD yesterday. This morning requiring blood tsf due to drop in Hgb, having decorticate posturing intermittently in the last 24hr.  cxr this am does not suggest any infiltrate per my read  Medications:  . sodium chloride   Intravenous Once  . chlorhexidine  15 mL Mouth Rinse BID  . famotidine (PEPCID) IV  20 mg Intravenous Q24H  . feeding supplement (NEPRO CARB STEADY)  1,000 mL Per Tube Q24H  . feeding supplement (PRO-STAT SUGAR FREE 64)  60 mL Per Tube TID  . mouth rinse  15 mL Mouth Rinse 10 times per day  . OXcarbazepine  900 mg Per Tube BID  . sodium bicarbonate  650 mg Per NG tube TID    Objective: Vital signs in last 24 hours: Temp:  [98 F (36.7 C)-101 F (38.3 C)] 99.6 F (37.6 C) (11/05 0850) Pulse Rate:  [106-127] 117 (11/05 0900) Resp:  [12-27] 18 (11/05 0900) BP: (103-161)/(64-102) 155/75 (11/05 0900) SpO2:  [95 %-100 %] 100 % (11/05 0900) FiO2 (%):  [40 %] 40 % (11/05 0800) Weight:  [162 lb 0.6 oz (73.5 kg)-169 lb 12.1 oz (77 kg)] 162 lb 0.6 oz (73.5 kg) (11/05 0758)  Physical Exam  Constitutional:  oriented to person, place, and time. appears well-developed and well-nourished. No distress.  HENT: [place]/AT, PERRLA, no scleral icterus Mouth/Throat: Oropharynx is clear and moist. No oropharyngeal exudate.  Cardiovascular: Normal rate, regular rhythm and  normal heart sounds. Exam reveals no gallop and no friction rub.  No murmur heard.  Pulmonary/Chest: Effort normal and breath sounds normal. No respiratory distress.  has no wheezes.  Neck = supple, no nuchal rigidity Abdominal: Soft. Bowel sounds are normal.  exhibits no distension. There is no tenderness.  Lymphadenopathy: no cervical adenopathy. No axillary adenopathy Neurological: alert and oriented to person, place, and time.  Skin: Skin is warm and dry. No rash noted. No erythema.  Psychiatric: a normal mood and affect.  behavior is normal.    Lab Results  Recent Labs  02/15/16 0229 02/15/16 1430 02/16/16 0511  WBC 13.3* 15.3* 13.8*  HGB 7.8* 7.4* 6.7*  HCT 23.9* 22.4* 20.2*  NA 141  --  139  K 5.7*  --  4.0  CL 105  --  97*  CO2 16*  --  26  BUN 146*  --  102*  CREATININE 11.56*  --  7.50*    Microbiology: 11/1 blood cx 1 of 4 bottles viridans strep 11/2 wound cx: skin flora Studies/Results: Dg Chest Port 1 View  Result Date: 02/16/2016 CLINICAL DATA:  Check endotracheal tube EXAM: PORTABLE CHEST 1 VIEW COMPARISON:  02/15/2016 FINDINGS: Endotracheal tube, nasogastric catheter and right central venous line are again seen. The nasogastric catheter has been advanced slightly and the tip  now lies within the stomach. Proximal side port still lies within the distal esophagus. The lungs are well aerated bilaterally. No focal infiltrate is seen. No bony abnormality is noted. IMPRESSION: Slight advancement of nasogastric catheter when compare with the prior exam. No new focal abnormality is seen. Electronically Signed   By: Inez Catalina M.D.   On: 02/16/2016 07:16   Dg Chest Port 1 View  Result Date: 02/15/2016 CLINICAL DATA:  Check endotracheal tube placement EXAM: PORTABLE CHEST 1 VIEW COMPARISON:  02/15/2016 FINDINGS: Temporary dialysis catheter is noted in the right jugular vein in satisfactory position. Endotracheal tube has been advanced slightly and now lies approximately  3.8 cm above the carina. Nasogastric catheter extends into the distal esophagus but does not cross the gastroesophageal junction. This could be advanced. The lungs are well aerated bilaterally. No focal infiltrate is seen. Stable right peritracheal density is noted. IMPRESSION: Tubes and lines as described above. The nasogastric catheter remains in the distal esophagus and should be advanced. The remainder of the exam is stable from the prior study. Electronically Signed   By: Inez Catalina M.D.   On: 02/15/2016 10:46   Dg Chest Port 1 View  Result Date: 02/15/2016 CLINICAL DATA:  Ventilator dependence. EXAM: PORTABLE CHEST 1 VIEW COMPARISON:  02/14/2016 FINDINGS: Endotracheal tube remains in place with tip 6.5 cm above the carina. Right jugular catheter terminates over the lower SVC, unchanged. Enteric tube terminates over the distal esophagus, similar to prior. Evaluation of mediastinal contours is limited by patient rotation. Increased soft tissue density in the right paratracheal region is unchanged as described on yesterday's examination. Cardiac silhouette is within normal limits for size. No airspace consolidation, edema, pleural effusion, or pneumothorax is identified. IMPRESSION: 1. Unchanged appearance of the chest.  Clear lungs. 2. Enteric tube in the distal esophagus.  Suggest advancing. Electronically Signed   By: Logan Bores M.D.   On: 02/15/2016 07:21   Dg Chest Port 1 View  Result Date: 02/14/2016 CLINICAL DATA:  Hypoxia with central catheter placement EXAM: PORTABLE CHEST 1 VIEW COMPARISON:  February 14, 2016 FINDINGS: Central catheter tip is in the superior vena cava. Endotracheal tube tip is 5.3 cm above the carina. Nasogastric tube tip is at the gastroesophageal junction with the side port above the gastroesophageal junction. No pneumothorax. There is no edema or consolidation. Heart size and pulmonary vascular normal. Soft tissue fullness in the right peritracheal region is stable and  concerning for potential right paratracheal region adenopathy. No bone lesions. IMPRESSION: Tube and catheter positions as described without pneumothorax. Note that the nasogastric tube tip is at the gastroesophageal junction with the side port above the gastroesophageal junction. Advise advancing nasogastric tube approximately 10 cm to insure that both nasogastric tube tip and side port are well within the stomach. Soft tissue fullness the right paratracheal region of uncertain etiology. It may be prudent to consider contrast enhanced chest CT to exclude possible adenopathy in this area. These results will be called to the ordering clinician or representative by the Radiologist Assistant, and communication documented in the PACS or zVision Dashboard. Electronically Signed   By: Lowella Grip III M.D.   On: 02/14/2016 16:25     Assessment/Plan: 40yo F with hx of malignant htn, seizure disorder, found to have stroke from hypertensive urgency, respiratory distress and also likely PRES, neurologic status remains poor where she remains unresponsive and has some decorticate posturing  Leukocytosis = can be from stress of event  Fever = repeat blood  cx at next fever, though I suspect it may be central fever since no other significant sources  Chest wall wound associated with malignancy breast ca = will switch piptazo to amox/clav by NG to treat for a total of 7 day. Currently day 4  Decorticate posturing = agree with primary team to get repeat imaging of head to see if there is progression of stroke.  Strep bacteremia = likely contaminant   [place], [facility] for Infectious Diseases Cell: [phone] Pager: [phone]  02/16/2016, 9:03 AM

## 2016-02-16 NOTE — Progress Notes (Signed)
CRITICAL VALUE ALERT  Critical value received:  Hgb = 6.7  Date of notification:  02/16/16  Time of notification:  0609  Critical value read back:Yes.    Nurse who received alert:  Delphia Grates   MD notified (1st page):  Dr Ashok Cordia  Time of first page:  0615  MD notified (2nd page):  Time of second page:  Responding MD:  Dr Ashok Cordia  Time MD responded:  [phone]

## 2016-02-17 ENCOUNTER — Inpatient Hospital Stay (HOSPITAL_COMMUNITY): Payer: Medicaid Other

## 2016-02-17 DIAGNOSIS — L089 Local infection of the skin and subcutaneous tissue, unspecified: Secondary | ICD-10-CM

## 2016-02-17 DIAGNOSIS — I6340 Cerebral infarction due to embolism of unspecified cerebral artery: Principal | ICD-10-CM

## 2016-02-17 DIAGNOSIS — B955 Unspecified streptococcus as the cause of diseases classified elsewhere: Secondary | ICD-10-CM

## 2016-02-17 DIAGNOSIS — C50011 Malignant neoplasm of nipple and areola, right female breast: Secondary | ICD-10-CM

## 2016-02-17 DIAGNOSIS — J9601 Acute respiratory failure with hypoxia: Secondary | ICD-10-CM

## 2016-02-17 LAB — PROTIME-INR
INR: 1.15
PROTHROMBIN TIME: 14.7 s (ref 11.4–15.2)

## 2016-02-17 LAB — CBC WITH DIFFERENTIAL/PLATELET
Basophils Absolute: 0 10*3/uL (ref 0.0–0.1)
Basophils Relative: 0 %
EOS ABS: 0 10*3/uL (ref 0.0–0.7)
Eosinophils Relative: 0 %
HCT: 25.7 % — ABNORMAL LOW (ref 36.0–46.0)
HEMOGLOBIN: 8.3 g/dL — AB (ref 12.0–15.0)
LYMPHS ABS: 1.4 10*3/uL (ref 0.7–4.0)
Lymphocytes Relative: 10 %
MCH: 28.4 pg (ref 26.0–34.0)
MCHC: 32.3 g/dL (ref 30.0–36.0)
MCV: 88 fL (ref 78.0–100.0)
Monocytes Absolute: 1.2 10*3/uL — ABNORMAL HIGH (ref 0.1–1.0)
Monocytes Relative: 8 %
NEUTROS ABS: 12.1 10*3/uL — AB (ref 1.7–7.7)
NEUTROS PCT: 82 %
Platelets: 387 10*3/uL (ref 150–400)
RBC: 2.92 MIL/uL — AB (ref 3.87–5.11)
RDW: 16.1 % — ABNORMAL HIGH (ref 11.5–15.5)
WBC: 14.7 10*3/uL — AB (ref 4.0–10.5)

## 2016-02-17 LAB — GLUCOSE, CAPILLARY
GLUCOSE-CAPILLARY: 118 mg/dL — AB (ref 65–99)
GLUCOSE-CAPILLARY: 124 mg/dL — AB (ref 65–99)
Glucose-Capillary: 105 mg/dL — ABNORMAL HIGH (ref 65–99)
Glucose-Capillary: 107 mg/dL — ABNORMAL HIGH (ref 65–99)
Glucose-Capillary: 106 mg/dL — ABNORMAL HIGH (ref 65–99)
Glucose-Capillary: 94 mg/dL (ref 65–99)

## 2016-02-17 LAB — RENAL FUNCTION PANEL
ANION GAP: 15 (ref 5–15)
Albumin: 2.3 g/dL — ABNORMAL LOW (ref 3.5–5.0)
BUN: 80 mg/dL — ABNORMAL HIGH (ref 6–20)
CHLORIDE: 97 mmol/L — AB (ref 101–111)
CO2: 26 mmol/L (ref 22–32)
CREATININE: 5.69 mg/dL — AB (ref 0.44–1.00)
Calcium: 8.4 mg/dL — ABNORMAL LOW (ref 8.9–10.3)
GFR, EST AFRICAN AMERICAN: 10 mL/min — AB (ref >60)
GFR, EST NON AFRICAN AMERICAN: 8 mL/min — AB (ref >60)
Glucose, Bld: 111 mg/dL — ABNORMAL HIGH (ref 65–99)
POTASSIUM: 3.8 mmol/L (ref 3.5–5.1)
Phosphorus: 6.3 mg/dL — ABNORMAL HIGH (ref 2.5–4.6)
Sodium: 138 mmol/L (ref 135–145)

## 2016-02-17 LAB — APTT: aPTT: 37 seconds — ABNORMAL HIGH (ref 24–36)

## 2016-02-17 LAB — TYPE AND SCREEN
ABO/RH(D): O POS
ANTIBODY SCREEN: NEGATIVE
UNIT DIVISION: 0

## 2016-02-17 LAB — CULTURE, BLOOD (ROUTINE X 2): Culture: NO GROWTH

## 2016-02-17 LAB — FIBRINOGEN

## 2016-02-17 MED ORDER — SODIUM BICARBONATE 650 MG PO TABS: 650.0000 mg | ORAL_TABLET | Freq: Two times a day (BID) | ORAL | Status: DC

## 2016-02-17 MED ORDER — ACETAMINOPHEN 160 MG/5ML PO SOLN
650.0000 mg | Freq: Four times a day (QID) | ORAL | Status: DC | PRN
  Administered 2016-02-17 – 2016-02-20 (×3): 650 mg
  Filled 2016-02-17 (×3): qty 20.3

## 2016-02-17 MED ORDER — ASPIRIN EC 325 MG PO TBEC
325.0000 mg | DELAYED_RELEASE_TABLET | Freq: Every day | ORAL | Status: DC
  Administered 2016-02-17 – 2016-02-22 (×6): 325 mg via ORAL
  Filled 2016-02-17 (×6): qty 1

## 2016-02-17 MED ORDER — METOPROLOL TARTRATE 25 MG PO TABS
25.0000 mg | ORAL_TABLET | Freq: Two times a day (BID) | ORAL | Status: DC
  Administered 2016-02-17 – 2016-02-22 (×11): 25 mg via ORAL
  Filled 2016-02-17 (×6): qty 1
  Filled 2016-02-17: qty 2
  Filled 2016-02-17 (×5): qty 1

## 2016-02-17 NOTE — Care Management Note (Addendum)
Case Management Note  Patient Details  Name: Samantha Pittman MRN: [id number] Date of Birth: 03/21/1976  Subjective/Objective:     Pt admitted s/p CVA               Action/Plan:  Pt found unresponsive .  ATN worse - multifactorial (K 5.3 and bic 16) - will d/w renal about HD - sepsis du eto strep - abx as below -PRES syndrome - remains comatose - Neuro planning MRI in 1 weeks from 02/24/16 - Acute encephalopathy coma - due to above - Breast mass - await bx from 02/13/16 - Will likel need trach given anticipoated long course.  CM will continue to follow for discharge needs    Expected Discharge Date:                  Expected Discharge Plan:     In-House Referral:     Discharge planning Services  CM Consult  Post Acute Care Choice:    Choice offered to:     DME Arranged:    DME Agency:     HH Arranged:    HH Agency:     Status of Service:  In process, will continue to follow  If discussed at Long Length of Stay Meetings, dates discussed:    Additional Comments: 02/17/2016 Pt remains on ventilator - only responds to pain.  Recent MRI showed increased edema and multiple acute infarcts.  Pt on IHD.  Maryclare Labrador, RN 02/17/2016, 10:26 AM

## 2016-02-17 NOTE — Progress Notes (Signed)
[place] for Infectious Disease   Reason for visit: Follow up on fever  Interval History: MRI repeated and EEG completed.    Physical Exam: Constitutional:  Vitals:   02/17/16 1222 02/17/16 1240  BP:  (!) 187/113  Pulse:    Resp:    Temp: (!) 100.4 F (38 C)   unresponsive Respiratory: Normal respiratory effort; CTA B Cardiovascular: tachy RR  Review of Systems: Constitutional: negative for fevers and chills Gastrointestinal: negative for diarrhea  Lab Results  Component Value Date   WBC 14.7 (H) 02/17/2016   HGB 8.3 (L) 02/17/2016   HCT 25.7 (L) 02/17/2016   MCV 88.0 02/17/2016   PLT 387 02/17/2016    Lab Results  Component Value Date   CREATININE 5.69 (H) 02/17/2016   BUN 80 (H) 02/17/2016   NA 138 02/17/2016   K 3.8 02/17/2016   CL 97 (L) 02/17/2016   CO2 26 02/17/2016    Lab Results  Component Value Date   ALT 178 (H) 02/15/2016   AST 33 02/15/2016   ALKPHOS 114 02/15/2016     Microbiology: Recent Results (from the past 240 hour(s))  MRSA PCR Screening     Status: None   Collection Time: 01/22/2016  3:19 PM  Result Value Ref Range Status   MRSA by PCR NEGATIVE NEGATIVE Final    Comment:        The GeneXpert MRSA Assay (FDA approved for NASAL specimens only), is one component of a comprehensive MRSA colonization surveillance program. It is not intended to diagnose MRSA infection nor to guide or monitor treatment for MRSA infections.   Culture, blood (Routine X 2) w Reflex to ID Panel     Status: Abnormal   Collection Time: 02/12/16  6:25 PM  Result Value Ref Range Status   Specimen Description BLOOD RIGHT ANTECUBITAL  Final   Special Requests BOTTLES DRAWN AEROBIC AND ANAEROBIC 8CC EA  Final   Culture  Setup Time   Final    GRAM POSITIVE COCCI IN PAIRS IN CHAINS ANAEROBIC BOTTLE ONLY CRITICAL RESULT CALLED TO, READ BACK BY AND VERIFIED WITH: Hughie Closs Pharm.D. 11:55 02/13/16 ([email or web address])    Culture (A)  Final    VIRIDANS  STREPTOCOCCUS THE SIGNIFICANCE OF ISOLATING THIS ORGANISM FROM A SINGLE SET OF BLOOD CULTURES WHEN MULTIPLE SETS ARE DRAWN IS UNCERTAIN. PLEASE NOTIFY THE MICROBIOLOGY DEPARTMENT WITHIN ONE WEEK IF SPECIATION AND SENSITIVITIES ARE REQUIRED.    Report Status 02/15/2016 FINAL  Final  Blood Culture ID Panel (Reflexed)     Status: Abnormal   Collection Time: 02/12/16  6:25 PM  Result Value Ref Range Status   Enterococcus species NOT DETECTED NOT DETECTED Final   Listeria monocytogenes NOT DETECTED NOT DETECTED Final   Staphylococcus species NOT DETECTED NOT DETECTED Final   Staphylococcus aureus NOT DETECTED NOT DETECTED Final   Streptococcus species DETECTED (A) NOT DETECTED Final    Comment: CRITICAL RESULT CALLED TO, READ BACK BY AND VERIFIED WITH: Hughie Closs Pharm.D. 11:55 02/13/16 ([email or web address])    Streptococcus agalactiae NOT DETECTED NOT DETECTED Final   Streptococcus pneumoniae NOT DETECTED NOT DETECTED Final   Streptococcus pyogenes NOT DETECTED NOT DETECTED Final   Acinetobacter baumannii NOT DETECTED NOT DETECTED Final   Enterobacteriaceae species NOT DETECTED NOT DETECTED Final   Enterobacter cloacae complex NOT DETECTED NOT DETECTED Final   Escherichia coli NOT DETECTED NOT DETECTED Final   Klebsiella oxytoca NOT DETECTED NOT DETECTED Final   Klebsiella pneumoniae NOT DETECTED NOT DETECTED Final  Proteus species NOT DETECTED NOT DETECTED Final   Serratia marcescens NOT DETECTED NOT DETECTED Final   Haemophilus influenzae NOT DETECTED NOT DETECTED Final   Neisseria meningitidis NOT DETECTED NOT DETECTED Final   Pseudomonas aeruginosa NOT DETECTED NOT DETECTED Final   Candida albicans NOT DETECTED NOT DETECTED Final   Candida glabrata NOT DETECTED NOT DETECTED Final   Candida krusei NOT DETECTED NOT DETECTED Final   Candida parapsilosis NOT DETECTED NOT DETECTED Final   Candida tropicalis NOT DETECTED NOT DETECTED Final  Culture, blood (Routine X 2) w Reflex to ID Panel     Status:  None   Collection Time: 02/12/16  6:36 PM  Result Value Ref Range Status   Specimen Description BLOOD LEFT ANTECUBITAL  Final   Special Requests BOTTLES DRAWN AEROBIC AND ANAEROBIC 6CC EA  Final   Culture NO GROWTH 5 DAYS  Final   Report Status 02/17/2016 FINAL  Final  Aerobic Culture (superficial specimen)     Status: None   Collection Time: 02/13/16 12:40 AM  Result Value Ref Range Status   Specimen Description BREAST  Final   Special Requests NONE  Final   Gram Stain   Final    MODERATE WBC PRESENT,BOTH PMN AND MONONUCLEAR ABUNDANT GRAM POSITIVE COCCI IN PAIRS ABUNDANT GRAM VARIABLE ROD    Culture NORMAL SKIN FLORA  Final   Report Status 02/15/2016 FINAL  Final  Culture, respiratory (NON-Expectorated)     Status: None (Preliminary result)   Collection Time: 02/17/16 11:40 AM  Result Value Ref Range Status   Specimen Description TRACHEAL ASPIRATE  Final   Special Requests NONE  Final   Gram Stain   Final    MODERATE WBC PRESENT, PREDOMINANTLY PMN RARE SQUAMOUS EPITHELIAL CELLS PRESENT RARE GRAM POSITIVE COCCI IN PAIRS    Culture PENDING  Incomplete   Report Status PENDING  Incomplete    Impression/Plan:  1. Fever - no current fever.  On augmentin for chest wall infection, day 5/7.   2. encephalopathy - MRI repeated and EEG.  Followed by neurology.  PRES, CVA.   3. Strep bacteremia 1/2 - c.w contaminate.  Blood cultures repeated.   I will sign off, please call with any questions or clinical changes.

## 2016-02-17 NOTE — Progress Notes (Signed)
PULMONARY / CRITICAL CARE MEDICINE   Name: Samantha Pittman MRN: [id number] DOB: Sep 03, 1975    ADMISSION DATE:  01/15/2016 CONSULTATION DATE:  01/16/2016  REFERRING MD:  Dr, Langley Gauss / [facility]   CHIEF COMPLAINT:  Weakness / Dizziness   BRIEF:  40 y/o F with PMH of tubal ligation, HTN,seizures who presented to [facility] on 10/30 with a 6 day history of weakness, dizziness and difficulty seeing out of her left eye.    Initial evaluation found the patient to be significantly hypertensive with presenting pressure of 224/160 (MAP 81).  Initial labs Na 135, K 3.6, Cl 93, AG 24, BUN 36, sr cr 2.94, glucose 184, mag 2.5, AST 31/ALT 14, alk phos 136, TSH 0.37, WBC 11.3, Hgb 11, and platelets 51.  UA was negative.  She was treated with captopril, ativan, amlodipine and labetalol to reduce blood pressure.  The patient was admitted to [facility] for further evaluation.  She later had a seizure.  Per report, she was felt to be post ictal while on the medical floor.  Am of 10/31, she remained altered and pupils were unequal.  Follow up CT of the head was obtained which demonstrated an abnormal new hypodensity in the head of the right caudate nucleus extending into the right lentiform nucleus, acute or early subacute infarct in this vicinity.  The patient was intubated and transferred to [facility] for further evaluation.        LINES/TUBES: OETT 10/31 >>  OGT 10/31 >> FOLEY 10/31 >> PIV x2  SIGNIFICANT EVENTS: 10/30 - Admit to [facility] with weakness, dizziness.  Hypertensive emergency.  Seizure > AMS,  10/31 - AMS continued, CT head worrisome for CVA, transferred to [facility]. EEG 10/31:  Mild generalized nonspecific continuous slowing of cerebral activity which can be seen with sedating medications as well as metabolic and toxic encephalopathies. No evidence of epileptiform activity was recorded.  11/1: MRI findings perhaps an usual appearance of Severe Posterior Reversible  Encephalopathy Syndrome.  11/2: Sepsis protocol initiated for fevers, elevated WBC, and elevated procalcitonin. ECHO with EF 65-70% and mildly increased pulmonary artery systolic pressure. Biopsy left breast mass 02/14/16 - Worsening of Cr and Oliguria. Lasix given. Still very obtunded. Per RN, only grimaces with oral care otherwise not very responsive. Start HD 02/15/16 - Making urine. Renal s/p HD . Remains unresponsive - doing PSV. 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 CT head with increased edema at sites of multiple acute infarcts. HD completed. S/p 1uPRBC.    SUBJECTIVE/OVERNIGHT/INTERVAL HX 02/17/16:  Still comatose with some response to pain.   VITAL SIGNS: BP (!) 156/101   Pulse (!) 117   Temp (!) 101.7 F (38.7 C) (Oral)   Resp 13   Wt 159 lb 2.8 oz (72.2 kg)   LMP  (LMP Unknown)   SpO2 100%   BMI 25.69 kg/m   HEMODYNAMICS:    VENTILATOR SETTINGS: Vent Mode: PRVC FiO2 (%):  [40 %] 40 % Set Rate:  [12 bmp] 12 bmp Vt Set:  [400 mL] 400 mL PEEP:  [5 cmH20] 5 cmH20 Pressure Support:  [10 cmH20] 10 cmH20  INTAKE / OUTPUT: I/O last 3 completed shifts: In: 2650 [I.V.:360; Blood:335; NG/GT:1855; IV Piggyback:100] Out: 160 [Urine:160]  PHYSICAL EXAMINATION: General:  Critically ill looking on vent Integument:  Warm & dry. No rash on exposed skin. No bruising.  HEENT:  No scleral injection or icterus. Pupils reactive but sluggish. Endotracheal tube in place.  Cardiovascular:  Regular rate. No edema. No appreciable JVD.  Pulmonary:  Clear bilaterally to auscultation. Symmetric chest wall rise on ventilator. Abdomen: Soft. Normal bowel sounds. Nondistended.  Musculoskeletal:  No joint deformity or effusion appreciated. Neurological:  No withdrawal to pain in extremities. Withdraws to sternal rub. No  spontaneous movements. No response to voice. RASS -5.  LABS:  PULMONARY  Recent Labs Lab 02/04/2016 1625 02/13/16 1955  PHART 7.392 7.323*  PCO2ART 29.6* 32.1  PO2ART 169* 83.0  HCO3 17.6* 16.7*  TCO2  --  18  O2SAT 99.4 96.0    CBC  Recent Labs Lab 02/15/16 1430 02/16/16 0511 02/16/16 1607 02/17/16 0248  HGB 7.4* 6.7* 8.3* 8.3*  HCT 22.4* 20.2* 24.8* 25.7*  WBC 15.3* 13.8*  --  14.7*  PLT 338 357  --  387    COAGULATION  Recent Labs Lab 02/13/16 0215 02/14/16 0238 02/15/16 0229 02/16/16 0511 02/17/16 0248  INR 1.38 1.27 1.54 1.35 1.15    CARDIAC    Recent Labs Lab 02/10/2016 2225 02/12/16 0444 02/12/16 1034 02/12/16 1701 02/12/16 2215  TROPONINI 1.88* 2.04* 1.97* 1.35* 2.23*   No results for input(s): PROBNP in the last 168 hours.   CHEMISTRY  Recent Labs Lab 02/13/16 0215 02/13/16 1711 02/14/16 0238 02/15/16 0229 02/15/16 1025 02/15/16 1430 02/16/16 0511 02/16/16 1600 02/17/16 0248  NA 138 140 139 141  --   --  139  --  138  K 5.2* 5.3* 5.3* 5.7*  --   --  4.0  --  3.8  CL 105 110 107 105  --   --  97*  --  97*  CO2 18* 15* 16* 16*  --   --  26  --  26  GLUCOSE 131* 112* 108* 109*  --   --  133*  --  111*  BUN 85* 103* 110* 146*  --   --  102*  --  80*  CREATININE 8.60* 9.54* 9.56* 11.56*  --   --  7.50*  --  5.69*  CALCIUM 8.5* 8.2* 8.8* 8.7*  --   --  8.0*  --  8.4*  MG 2.3  --  2.6* 2.9* 3.0* 3.1* 2.8* 2.3  --   PHOS 8.6*  --  10.6* 13.2*  --   --  8.4*  --  6.3*   CrCl cannot be calculated (Unknown ideal weight.).   LIVER  Recent Labs Lab 02/02/2016 1711 02/13/16 0215 02/13/16 1711 02/14/16 0238 02/15/16 0229 02/16/16 0511 02/17/16 0248  AST 29  --  210*  --  33  --   --   ALT 18  --  271*  --  178*  --   --   ALKPHOS 100  --  115  --  114  --   --   BILITOT 0.4  --  0.6  --  0.8  --   --   PROT 6.3*  --  5.5*  --  6.8  --   --   ALBUMIN 3.1* 2.5* 2.4* 2.5* 2.5*  2.5* 2.1* 2.3*  INR 1.17  1.22 1.38  --  1.27 1.54  1.35 1.15     INFECTIOUS  Recent Labs Lab 02/12/16 1825 02/13/16 0215 02/13/16 1711 02/14/16 0238  LATICACIDVEN  --   --  1.0  --   PROCALCITON 0.73 8.77  --  18.68     ENDOCRINE CBG (last  3)   Recent Labs  02/16/16 1943 02/17/16 0001 02/17/16 0352  GLUCAP 109* 124* 105*         IMAGING x48h  - image(s) personally visualized  -   highlighted in bold Ct Head Wo Contrast  Result Date: 02/16/2016 CLINICAL DATA:  Followup acute cerebral infarcts. EXAM: CT HEAD WITHOUT CONTRAST TECHNIQUE: Contiguous axial images were obtained from the base of the skull through the vertex without intravenous contrast. COMPARISON:  Brain MRI on 02/12/2016 and head CT on 01/13/2016 FINDINGS: Brain: No evidence of intracranial hemorrhage. Increased edema is seen at sites of acute infarcts involving the bilateral basal ganglia, right thalamus and bilateral deep periventricular white matter. Increased edema is also seen at sites of acute infarcts involving the right occipital lobe and left cerebellum. Diffuse edema is again seen involving the pons and medulla. Mild mass effect is seen on the right frontal horn, however there is no evidence of midline shift. No evidence hydrocephalus. Vascular: No hyperdense vessel or unexpected calcification. Skull: Normal. Negative for fracture or focal lesion. Sinuses/Orbits: No acute finding. Other: None. IMPRESSION: Increased edema at sites of multiple acute infarcts, as described above. Mild mass effect on right frontal horn, without midline shift or hydrocephalus. No evidence of intracranial hemorrhage. Electronically Signed   By: Earle Gell M.D.   On: 02/16/2016 16:22   Dg Chest Port 1 View  Result Date: 02/17/2016 CLINICAL DATA:  Shortness of breath. EXAM: PORTABLE CHEST 1 VIEW COMPARISON:  02/16/2016. FINDINGS: Endotracheal tube right IJ line stable position. NG tube in stable position with tip just under the left hemidiaphragm. Advancement of the NG tube  approximately 10 cm should be considered. Low lung volumes with mild right base subsegmental atelectasis . No pleural effusion or pneumothorax. Stable cardiomegaly. No pulmonary venous congestion. No acute bony abnormality . IMPRESSION: 1. Lines and tubes in stable position. NG tube tip is just below left hemidiaphragm. Advancement of approximately 10 cm should be considered. 2. Stable cardiomegaly. 3.  Mild right base subsegmental atelectasis. These results will be called to the ordering clinician or representative by the Radiologist Assistant, and communication documented in the PACS or zVision Dashboard. Electronically Signed   By: Marcello Moores  Register   On: 02/17/2016 07:11   Dg Chest Port 1 View  Result Date: 02/16/2016 CLINICAL DATA:  Check endotracheal tube EXAM: PORTABLE CHEST 1 VIEW COMPARISON:  02/15/2016 FINDINGS: Endotracheal tube, nasogastric catheter and right central venous line are again seen. The nasogastric catheter has been advanced slightly and the tip now lies within the stomach. Proximal side port still lies within the distal esophagus. The lungs are well aerated bilaterally. No focal infiltrate is seen. No bony abnormality is noted. IMPRESSION: Slight advancement of nasogastric catheter when compare with the prior exam. No new focal abnormality is seen. Electronically Signed   By: Inez Catalina M.D.   On: 02/16/2016 07:16   Dg Chest Port 1 View  Result Date: 02/15/2016 CLINICAL DATA:  Check endotracheal tube placement EXAM: PORTABLE CHEST 1 VIEW COMPARISON:  02/15/2016 FINDINGS: Temporary dialysis catheter is noted in the right jugular vein in satisfactory position. Endotracheal tube has been advanced slightly and now lies approximately 3.8 cm above the carina. Nasogastric catheter extends into the distal esophagus but does not cross the gastroesophageal junction. This could be advanced. The lungs are well aerated bilaterally. No focal infiltrate is seen. Stable right peritracheal density  is noted. IMPRESSION: Tubes and lines as described above. The nasogastric catheter remains in the distal esophagus  and should be advanced. The remainder of the exam is stable from the prior study. Electronically Signed   By: Inez Catalina M.D.   On: 02/15/2016 10:46       ASSESSMENT / PLAN:  NEUROLOGIC A:   Acute Encephalopathy - Unclear etiology. CVA vs PRES vs TTP. 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 MRI with extensive edema without hemorrhage. Superimposed scattered acute infarcts.  Seizure - Occurred w/ correction of hypertensive urgency/emergency. H/O Seizure Disorder   - still unresponsive/comatose . Possible decerebrate posturing. CT head with increased edema at sites of multiple acute infarcts.   P:   MRI brain pending  RASS goal: 0  Neurology Consulted & following Seizure Precautions AEDs per Neurology:  Trileptal avopid benzo and opioids   CARDIOVASCULAR A:  Hypertensive Emergency - Initially.   Elevated Troponin I - Likely subendocardial ischemia. H/O HTN - No medications as an outpatient.   = mildly hypertensive currently   P:  Continuous Telemetry Monitoring Vitals per Unit Protocol Goal SBP >95 Hydralazine prn    RENAL A:   Acute Renal Failure - Most likely ischemic ATN. /multifactorial - sepsis, ACe inhibitor, high bp   -now requiring HD and anuric, Cr improving    P:   Per renal, plan for HD again 02/17/16    HEMATOLOGIC/ONCOLOGIC A:   Anemia - No signs of active bleeding. Prob due to sepsis. S/p 1uPRBC.  Thrombocytopenia, Resolved    - Schistocytes on smear. 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   P:   Full Vent  Support Intermittent ABG & Portable CXR Weaning FiO2 for Sat >92% Albuterol neb prn  GASTROINTESTINAL A:   No acute issues.  P:   NPO Pepcid IV q24hr  Tube feedings   INFECTIOUS  MICROBIOLOGY: MRSA PCR 10/31:  Negative Blood Cx 11/1: Strep viridans  Breast Wound Culture: Normal skin flora  Hep B negative    A:   Strep viridans bacteremia  - from admit 02/12/2016. ID suspect contaminant Fungating left breast mass   -11/6: Still with fevers   P:   abx per ID Consider re-culture given persistent fevers    ENDOCRINE A:   No acute issues.   P:   Monitor glucose with daily labs.   FAMILY  - Updates: No family at bedside   Phill Myron, [place]. 02/17/2016, 7:16 AM PGY-2, [place]   Attending:  I have seen and examined the patient with nurse practitioner/resident and agree with the note above.  We formulated the plan together and I elicited the following history.    Having fevers Posturing For repeat MRI  On exam Decerebrate posturing on exam to touch Rhonchi, vent supported breaths Belly soft  MRI 11/1 > scattered infarcts, extensive edema bilateral pons and medulla  Acute encephalopathy due to strokes, unexplained pons/medulla inflammation> plan repeat MRI brain today, is there a role for PLEX? Acute respiratory failure wit hhypoxemia> continue full vent support as she is unable to protect airway Fever> re-culture  Rest as above  My cc time 31 minutes  [name], MD [place] [facility] Pager: [phone] Cell: [phone] After 3pm or if no response, call [phone]

## 2016-02-17 NOTE — Progress Notes (Signed)
Arrived to patient room 87M-10 at 1617.  Reviewed treatment plan and this RN agrees with plan.  Report received from bedside RN, Helene Kelp.  Consent verified.  Patient unresponsive.   Lung sounds diminished to ausculation in all fields. Generalized edema. Cardiac:  ST.  Removed caps and cleansed RIJ catheter with chlorhedxidine.  Aspirated ports of heparin and flushed them with saline per protocol.  Connected and secured lines, initiated treatment at 1623.  UF Goal of 2015mL and net fluid removal 1.5L.  Will continue to monitor.

## 2016-02-17 NOTE — Progress Notes (Addendum)
STROKE TEAM PROGRESS NOTE   SUBJECTIVE (INTERVAL HISTORY) Patient son and other family members are at bedside. Pt is going to have MRI repeat. Still intubated and not following commands. Decerebrate posturing on stimulation.   OBJECTIVE Temp:  [98.7 F (37.1 C)-102.4 F (39.1 C)] 99.9 F (37.7 C) (11/06 0747) Pulse Rate:  [111-132] 117 (11/06 0702) Cardiac Rhythm: Sinus tachycardia (11/06 0600) Resp:  [11-27] 13 (11/06 0702) BP: (123-164)/(70-108) 156/101 (11/06 0702) SpO2:  [99 %-100 %] 100 % (11/06 0702) FiO2 (%):  [40 %] 40 % (11/06 0600) Weight:  [159 lb 2.8 oz (72.2 kg)-162 lb 0.6 oz (73.5 kg)] 159 lb 2.8 oz (72.2 kg) (11/06 0419)  CBC:   Recent Labs Lab 02/16/16 0511 02/16/16 1607 02/17/16 0248  WBC 13.8*  --  14.7*  NEUTROABS 12.3*  --  12.1*  HGB 6.7* 8.3* 8.3*  HCT 20.2* 24.8* 25.7*  MCV 86.7  --  88.0  PLT 357  --  [id number]    Basic Metabolic Panel:   Recent Labs Lab 02/16/16 0511 02/16/16 1600 02/17/16 0248  NA 139  --  138  K 4.0  --  3.8  CL 97*  --  97*  CO2 26  --  26  GLUCOSE 133*  --  111*  BUN 102*  --  80*  CREATININE 7.50*  --  5.69*  CALCIUM 8.0*  --  8.4*  MG 2.8* 2.3  --   PHOS 8.4*  --  6.3*    Lipid Panel:     Component Value Date/Time   CHOL 183 02/13/2016 0215   TRIG 83 02/13/2016 0215   HDL 66 02/13/2016 0215   CHOLHDL 2.8 02/13/2016 0215   VLDL 17 02/13/2016 0215   LDLCALC 100 (H) 02/13/2016 0215   HgbA1c:  Lab Results  Component Value Date   HGBA1C 4.7 (L) 02/13/2016   Urine Drug Screen:     Component Value Date/Time   LABOPIA NONE DETECTED 02/12/2016 1319   COCAINSCRNUR NONE DETECTED 02/12/2016 1319   LABBENZ NONE DETECTED 02/12/2016 1319   AMPHETMU NONE DETECTED 02/12/2016 1319   THCU NONE DETECTED 02/12/2016 1319   LABBARB NONE DETECTED 02/12/2016 1319    IMAGING I have personally reviewed the radiological images below and agree with the radiology interpretations.  Ct Head Wo Contrast 02/16/2016 IMPRESSION:  Increased edema at sites of multiple acute infarcts, as described above. Mild mass effect on right frontal horn, without midline shift or hydrocephalus. No evidence of intracranial hemorrhage.    Dg Chest Port 1 View 02/17/2016 IMPRESSION: 1. Lines and tubes in stable position. NG tube tip is just below left hemidiaphragm. Advancement of approximately 10 cm should be considered. 2. Stable cardiomegaly. 3.  Mild right base subsegmental atelectasis. These results will be called to the ordering clinician or representative by the Radiologist Assistant, and communication documented in the PACS or zVision Dashboard.   MRI/MRA  02/12/2016 1. Extensive edema affecting the bilateral pons and medulla with associated brainstem swelling. No associated hemorrhage. 2. 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 No associated hemorrhage or mass effect. 3. Intracranial MRA is negative for emergent large vessel occlusion. There is at most mild irregularity of the bilateral PCAs and the right ACA.   EEG  This EEG is abnormal with mild generalized nonspecific continuous slowing of cerebral activity which can be seen with sedating medications as well as metabolic and toxic encephalopathies.  No evidence of epileptiform activity was recorded.  CUS Could not obtain images from right carotid due to poor patient position with head turned completely to the right. Tried to reposition patient multiple times but she kept head firmly to the right. Left :No significant (1-39%) ICA stenosis. Antegrade vertebral flow.  Echocardiogram 02/13/2016 Left ventricle:  The cavity size was normal. There was moderate concentric hypertrophy.  Systolic function was vigorous. The estimated ejection fraction was in the range of 65% to 70%.  Wall motion was normal; there were no regional wall motion  MRI repeat  pending  EEG pending  LE venous doppler pending   PHYSICAL EXAM  Temp:  [98.7 F (37.1 C)-102.4 F (39.1 C)] 99.9 F (37.7 C) (11/06 0747) Pulse Rate:  [111-132] 117 (11/06 0702) Resp:  [11-27] 13 (11/06 0702) BP: (123-164)/(70-108) 156/101 (11/06 0702) SpO2:  [99 %-100 %] 100 % (11/06 0702) FiO2 (%):  [40 %] 40 % (11/06 0600) Weight:  [159 lb 2.8 oz (72.2 kg)-162 lb 0.6 oz (73.5 kg)] 159 lb 2.8 oz (72.2 kg) (11/06 0419)  General - Well nourished, well developed, intubated.  Ophthalmologic - Fundi not visualized due to noncooperation.  Cardiovascular - Regular rate and rhythm.  Neuro - intubated, not responsive to commands. Eye closed and pupil 25mm bilaterally and not respond to light. Doll's eyes present but sluggish, corneal and gag present. On pain stimulation, b/l UE extension posturing, RLE no movement and LLE trace withdraw. B/l babinski positive. DTR 1+. Sensation, coordination and gait not tested.    ASSESSMENT/PLAN Ms. Hermela Needleman is a 40 y.o. female with history of HTN and seizures presenting to outlying hospital with hypertensive emergency followed by seizure. She did not present with stroke symptoms.   Stroke: bilateral supra and infratentorial scattered acute infarcts, embolic pattern with unknown source. Hypercoagulable state due to malignancy or sepsis in possible.  CT at outside hospital with R caudate lacunar infarct  MRI - scattered acute infarcts bilateral anterior and posterior circulation.  MRA - negative for emergent large vessel occlusion  Carotid Doppler unremarkable   2D Echo - EF 65-70%. No cardiac source of emboli identified.  LE venous doppler pending  LDL  100  HgbA1c 4.7  Heparin subq for VTE prophylaxis Diet NPO time specified, on TF  No antithrombotic prior to admission, now on ASA 325mg .  Ongoing aggressive stroke risk factor management  Therapy recommendations:  pending  Disposition:  pending   Brainstem edema - ?  Atypical PRES due to hypertensive emergency  MRI brain - extensive brainstem edema  Repeat MRI pending  BP control - add po metoprolol  BP goal < 160  Seizure   Not related to stroke seen on CT  EEG generalized nonspecific slowing  Continue trileptal 300 bid  Had new decerebrate posturing 02/16/16  Repeat EEG pending  Respiratory failure  Intubated after seizure for airway protection  CCM on board  AKI  Cre worsened and needed HD  Cre 11.56->7.50->5.69  Nephrology on board  ? Sepsis  Febrile   1/4 BCx positive  ID on board, consider contamination for B Cx  Breast wound could be the source of infection  On augmentin  Left breast cancer  Ulcerating lesion  Biopsy confirmed carcinoma  Would care  Other Active Problems  Mild anemia 7.4->6.7->8.3  Leukocytosis 15.3->13.8->14.7  Hospital day # 6   This patient is critically ill due to embolic stroke, likely PRES involving brainstem edema, AKI needing HD, sepsis and respiratory failure and at  significant risk of neurological worsening, death form recurrent stroke, hemorrhagic transformation, kidney failure, heart failure. This patient's care requires constant monitoring of vital signs, hemodynamics, respiratory and cardiac monitoring, review of multiple databases, neurological assessment, discussion with family, other specialists and medical decision making of high complexity. I spent 45 minutes of neurocritical care time in the care of this patient.   Rosalin Hawking, MD PhD Stroke Neurology 02/17/2016 9:36 AM

## 2016-02-17 NOTE — Progress Notes (Signed)
Dialysis treatment completed.  2000 mL ultrafiltrated.  1500 mL net fluid removal.  Patient status unchanged. Lung sounds diminished, coarse to ausculation in all fields. Generalized edema. Cardiac: ST.  Cleansed RIJ catheter with chlorhexidine.  Disconnected lines and flushed ports with saline per protocol.  Ports locked with heparin and capped per protocol.    Report given to bedside, RN Gerald Stabs.

## 2016-02-17 NOTE — Progress Notes (Signed)
Pt's temp 102.4, DR Ashok Cordia notified. Orders to change tylenol to per tube noted. No other orders at this time.

## 2016-02-17 NOTE — Procedures (Signed)
ELECTROENCEPHALOGRAM REPORT  Date of Study: 02/17/2016  Patient's Name: Samantha Pittman MRN: [id number] Date of Birth: Dec 23, 1975  Referring Provider: Dr. Rosalin Hawking  Clinical History: This is a 40 year old woman with a history of seizures admitted for hypertensive emergency, seizure, and embolic stroke.   Medications: acetaminophen (TYLENOL) solution 650 mg  albuterol (PROVENTIL) (2.5 MG/3ML) 0.083% nebulizer solution 2.5 mg  alteplase (CATHFLO ACTIVASE) injection 2 mg  amoxicillin-clavulanate (AUGMENTIN) 400-57 MG/5ML suspension 504 mg  aspirin EC tablet 325 mg  chlorhexidine (PERIDEX) 0.12 % solution 15 mL  famotidine (PEPCID) IVPB 20 mg premix  feeding supplement (NEPRO CARB STEADY) liquid 1,000 mL  feeding supplement (PRO-STAT SUGAR FREE 64) liquid 60 mL  heparin injection 1,000 Units  heparin injection 2,900 Units  hydrALAZINE (APRESOLINE) injection 10 mg  lidocaine (PF) (XYLOCAINE) 1 % injection 5 mL  lidocaine-prilocaine (EMLA) cream 1 application  MEDLINE mouth rinse  metoprolol tartrate (LOPRESSOR) tablet 25 mg  midazolam (VERSED) injection 1 mg  OXcarbazepine (TRILEPTAL) 300 MG/5ML suspension 900 mg  pentafluoroprop-tetrafluoroeth (GEBAUERS) aerosol 1 application   Technical Summary: A multichannel digital EEG recording measured by the international 10-20 system with electrodes applied with paste and impedances below 5000 ohms performed as portable with EKG monitoring in an intubated patient off sedation.  Hyperventilation and photic stimulation were not performed.  The digital EEG was referentially recorded, reformatted, and digitally filtered in a variety of bipolar and referential montages for optimal display.   Description: The patient is intubated and unresponsive during the recording. No sedating medication listed. There is no clear posterior dominant rhythm seen. The background consists of a large amount of diffuse 4-6 Hz theta and 2-3 Hz delta slowing, at times  sharply contoured without clear epileptogenic potential. There were occasional vertex waves seen. Hyperventilation and photic stimulation were not performed. With noxious stimulation, there is a slight increase in faster frequencies and muscle artifact. There were no epileptiform discharges or electrographic seizures seen.    EKG lead showed tachycardia.  Impression: This EEG is abnormal due to mild to moderate diffuse slowing of the background.  Clinical Correlation of the above findings indicates diffuse cerebral dysfunction that is non-specific in etiology and can be seen with hypoxic/ischemic injury, toxic/metabolic encephalopathies, or medication effect.  The absence of epileptiform discharges does not rule out a clinical diagnosis of epilepsy.  Clinical correlation is advised.   [name], M.D.

## 2016-02-17 NOTE — Progress Notes (Signed)
Patient ID: Samantha Pittman, female   DOB: 24-Nov-1975, 40 y.o.   MRN: [id number]  [facility] Progress Note    Background: 40 y.o. female with CVA/PRES, AKI d/t ischemic ATN requiring dialysis (initiated 02/16/16). Baseline creatinine not known - 2.64 on admission to outside hospital   Assessment/ Plan:    1. Acute renal failure:  Suspect hemodynamically mediated with ischemic ATN. Minimal UOP at this time (680/24 hours, 45 so far today)  BUN/creatinine 80/5.69 from 102/7.5 reflecting HD yesterday.  S/P HD for clearance 11/5 and planned again for today to exclude any element of uremic encephalopathy. Reassess in the am 2. Metabolic acidosis -corrected. Dialysis will manage. Stop NG bicarb  3. Malignant hypertension: Currently mildly hypertensive but where neuro wants. Prn hydralazine.  4. Anemia/thrombocytopenia: s/p 1 unit with HD yesterday. Hb 8.3 from 6.7. Check iron panel. Follow trends 5. 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 6. Seizure disorder/altered mentation:  MRI suspicious for PRES. CT increased edema at sites of multiple acute infarcts. Repeat MRI today 11/6 new areas of infarct, Remains unresponsive. Now with decerebrate posturing.  7. Sepsis: 1 out of 4 blood cultures positive for strep. Source suspected to be from her fungating breast mass. Zosyn and vancomycin-->augmentin. ID seeing.  Subjective:    Noted to have more decerebrate posturing with stimulation. MRI with new areas infarction, some reduction in edema. BP running 150's/100-110 getting prn hydralazine. Minimal UOP now.    Objective:   BP (!) 151/110   Pulse (!) 106   Temp 99.9 F (37.7 C) (Oral)   Resp 15   Wt 72.2 kg (159 lb 2.8 oz)   LMP  (LMP Unknown)   SpO2 100%   BMI 25.69 kg/m   Intake/Output Summary (Last 24 hours) at 02/17/16 1146 Last data filed at 02/17/16 0700  Gross per 24 hour  Intake              1260 ml  Output               45 ml  Net             1215 ml   Weight change: -3.5 kg (-7 lb 11.5 oz)  Physical Exam: [id number], sedated Decerebrate posturing R IJ HD cath (11/4) CVS: Regular tachycardia Dynamic precordium S1 and S2 2/6 systolic murmur Clear to auscultation bilaterally, no rales Abd: Soft, flat, nontender No edema. Legs immobilized.  Imaging: Ct Head Wo Contrast  Result Date: 02/16/2016 CLINICAL DATA:  Followup acute cerebral infarcts. EXAM: CT HEAD WITHOUT CONTRAST TECHNIQUE: Contiguous axial images were obtained from the base of the skull through the vertex without intravenous contrast. COMPARISON:  Brain MRI on 02/12/2016 and head CT on 01/21/2016 FINDINGS: Brain: No evidence of intracranial hemorrhage. Increased edema is seen at sites of acute infarcts involving the bilateral basal ganglia, right thalamus and bilateral deep periventricular white matter. Increased edema is also seen at sites of acute infarcts involving the right occipital lobe and left cerebellum. Diffuse edema is again seen involving the pons and medulla. Mild mass effect is seen on the right frontal horn, however there is no evidence of midline shift. No evidence hydrocephalus. Vascular: No hyperdense vessel or unexpected calcification. Skull: Normal. Negative for fracture or focal lesion. Sinuses/Orbits: No acute finding. Other: None. IMPRESSION: Increased edema at sites of multiple acute infarcts, as described above. Mild mass effect on right frontal horn, without midline shift or hydrocephalus. No  evidence of intracranial hemorrhage. Electronically Signed   By: Earle Gell M.D.   On: 02/16/2016 16:22   Mr Brain Wo Contrast  Result Date: 02/17/2016 CLINICAL DATA:  Stroke.  Hypertension.  Breast cancer. EXAM: MRI HEAD WITHOUT CONTRAST TECHNIQUE: Multiplanar, multiecho pulse sequences of the brain and surrounding structures were obtained without intravenous contrast. COMPARISON:  MRI 02/12/2016  FINDINGS: Brain: Restricted diffusion ni the cerebellar hemispheres left greater than right is unchanged compatible with acute infarction. Multiple areas of restricted diffusion are present in the basal ganglia right greater than left similar to the prior study compatible with acute infarct. Multiple small areas of restricted diffusion in the cerebral white matter bilaterally are mostly the same however there is a new area of restricted diffusion in the right occipital white matter which is relatively small. This is consistent with a new area of acute infarct. Hyperintense signal on diffusion in the posterior spinal cord at the C1 level is seen on both axial and coronal images including repeat thin section imaging and this may be due to cord infarct. This is unchanged from the prior MRI. Hyperintensity in the pons has improved in the interval . Decrease swelling and mass-effect on the pons. Ventricle size remains normal. Negative for hemorrhage or mass. No shift of the midline structures. Negative for hydrocephalus. Vascular: Normal arterial flow void. Skull and upper cervical spine: Negative Sinuses/Orbits: Mild mucosal edema in the paranasal sinuses. Normal orbit. Other: None IMPRESSION: Multiple areas of acute infarct are similar to the recent MRI. In addition, there may be a small area of infarct in the posterior spinal cord at the C1 level which is similar to the prior study. Cannot completely exclude artifact. There is a new area of patchy restricted diffusion in the right occipital white matter compatible with interval new infarct. Interval improvement in diffuse edema in the pons. Differential diagnosis includes posterior reversible encephalopathy syndrome and osmotic demyelinization. Electronically Signed   By: Franchot Gallo M.D.   On: 02/17/2016 11:56   Dg Chest Port 1 View  Result Date: 02/17/2016 CLINICAL DATA:  Shortness of breath. EXAM: PORTABLE CHEST 1 VIEW COMPARISON:  02/16/2016. FINDINGS:  Endotracheal tube right IJ line stable position. NG tube in stable position with tip just under the left hemidiaphragm. Advancement of the NG tube approximately 10 cm should be considered. Low lung volumes with mild right base subsegmental atelectasis . No pleural effusion or pneumothorax. Stable cardiomegaly. No pulmonary venous congestion. No acute bony abnormality . IMPRESSION: 1. Lines and tubes in stable position. NG tube tip is just below left hemidiaphragm. Advancement of approximately 10 cm should be considered. 2. Stable cardiomegaly. 3.  Mild right base subsegmental atelectasis. These results will be called to the ordering clinician or representative by the Radiologist Assistant, and communication documented in the PACS or zVision Dashboard. Electronically Signed   By: Marcello Moores  Register   On: 02/17/2016 07:11   Dg Chest Port 1 View  Result Date: 02/16/2016 CLINICAL DATA:  Check endotracheal tube EXAM: PORTABLE CHEST 1 VIEW COMPARISON:  02/15/2016 FINDINGS: Endotracheal tube, nasogastric catheter and right central venous line are again seen. The nasogastric catheter has been advanced slightly and the tip now lies within the stomach. Proximal side port still lies within the distal esophagus. The lungs are well aerated bilaterally. No focal infiltrate is seen. No bony abnormality is noted. IMPRESSION: Slight advancement of nasogastric catheter when compare with the prior exam. No new focal abnormality is seen. Electronically Signed   By: Elta Guadeloupe  Lukens M.D.   On: 02/16/2016 07:16      Recent Labs Lab 02/03/2016 1711 02/12/16 0444 02/13/16 0215 02/13/16 1711 02/14/16 0238 02/15/16 0229 02/16/16 0511 02/17/16 0248  NA 138 138 138 140 139 141 139 138  K 4.1 4.8 5.2* 5.3* 5.3* 5.7* 4.0 3.8  CL 104 107 105 110 107 105 97* 97*  CO2 20* 18* 18* 15* 16* 16* 26 26  GLUCOSE 136* 115* 131* 112* 108* 109* 133* 111*  BUN 56* 64* 85* 103* 110* 146* 102* 80*  CREATININE 6.47* 7.13* 8.60* 9.54* 9.56*  11.56* 7.50* 5.69*  CALCIUM 8.3* 8.4* 8.5* 8.2* 8.8* 8.7* 8.0* 8.4*  PHOS 6.7* 6.9* 8.6*  --  10.6* 13.2* 8.4* 6.3*     Recent Labs Lab 02/14/16 0238 02/15/16 0229 02/15/16 1430 02/16/16 0511 02/16/16 1607 02/17/16 0248  WBC 10.7* 13.3* 15.3* 13.8*  --  14.7*  NEUTROABS 9.6* 11.8*  --  12.3*  --  12.1*  HGB 7.3* 7.8* 7.4* 6.7* 8.3* 8.3*  HCT 22.1* 23.9* 22.4* 20.2* 24.8* 25.7*  MCV 88.4 88.8 89.2 86.7  --  88.0  PLT 238 325 338 357  --  387    Medications:    . sodium chloride   Intravenous Once  . amoxicillin-clavulanate  500 mg of amoxicillin Oral Q24H  . aspirin EC  325 mg Oral Daily  . chlorhexidine  15 mL Mouth Rinse BID  . famotidine (PEPCID) IV  20 mg Intravenous Q24H  . feeding supplement (NEPRO CARB STEADY)  1,000 mL Per Tube Q24H  . feeding supplement (PRO-STAT SUGAR FREE 64)  60 mL Per Tube TID  . mouth rinse  15 mL Mouth Rinse 10 times per day  . metoprolol tartrate  25 mg Oral BID  . OXcarbazepine  900 mg Per Tube BID  . sodium bicarbonate  650 mg Per NG tube TID   [name], MD [facility] [phone] Pager 02/17/2016, 12:08 PM

## 2016-02-17 NOTE — Progress Notes (Signed)
EEG completed, results pending. 

## 2016-02-18 ENCOUNTER — Inpatient Hospital Stay (HOSPITAL_COMMUNITY): Payer: Medicaid Other

## 2016-02-18 DIAGNOSIS — R6520 Severe sepsis without septic shock: Secondary | ICD-10-CM

## 2016-02-18 DIAGNOSIS — A419 Sepsis, unspecified organism: Secondary | ICD-10-CM

## 2016-02-18 DIAGNOSIS — I639 Cerebral infarction, unspecified: Secondary | ICD-10-CM

## 2016-02-18 LAB — RENAL FUNCTION PANEL
Albumin: 2.5 g/dL — ABNORMAL LOW (ref 3.5–5.0)
Anion gap: 16 — ABNORMAL HIGH (ref 5–15)
BUN: 58 mg/dL — AB (ref 6–20)
CHLORIDE: 95 mmol/L — AB (ref 101–111)
CO2: 26 mmol/L (ref 22–32)
CREATININE: 4.05 mg/dL — AB (ref 0.44–1.00)
Calcium: 8.9 mg/dL (ref 8.9–10.3)
GFR calc Af Amer: 15 mL/min — ABNORMAL LOW (ref >60)
GFR, EST NON AFRICAN AMERICAN: 13 mL/min — AB (ref >60)
GLUCOSE: 114 mg/dL — AB (ref 65–99)
POTASSIUM: 3.9 mmol/L (ref 3.5–5.1)
Phosphorus: 5.6 mg/dL — ABNORMAL HIGH (ref 2.5–4.6)
Sodium: 137 mmol/L (ref 135–145)

## 2016-02-18 LAB — GLUCOSE, CAPILLARY
GLUCOSE-CAPILLARY: 119 mg/dL — AB (ref 65–99)
GLUCOSE-CAPILLARY: 106 mg/dL — AB (ref 65–99)
GLUCOSE-CAPILLARY: 115 mg/dL — AB (ref 65–99)
GLUCOSE-CAPILLARY: 125 mg/dL — AB (ref 65–99)
Glucose-Capillary: 111 mg/dL — ABNORMAL HIGH (ref 65–99)
Glucose-Capillary: 116 mg/dL — ABNORMAL HIGH (ref 65–99)
Glucose-Capillary: 121 mg/dL — ABNORMAL HIGH (ref 65–99)

## 2016-02-18 LAB — CBC WITH DIFFERENTIAL/PLATELET
BASOS PCT: 0 %
Basophils Absolute: 0 10*3/uL (ref 0.0–0.1)
Eosinophils Absolute: 0 10*3/uL (ref 0.0–0.7)
Eosinophils Relative: 0 %
HEMATOCRIT: 28.2 % — AB (ref 36.0–46.0)
Hemoglobin: 9.3 g/dL — ABNORMAL LOW (ref 12.0–15.0)
LYMPHS ABS: 1.2 10*3/uL (ref 0.7–4.0)
Lymphocytes Relative: 7 %
MCH: 29.3 pg (ref 26.0–34.0)
MCHC: 33 g/dL (ref 30.0–36.0)
MCV: 89 fL (ref 78.0–100.0)
MONO ABS: 1.3 10*3/uL — AB (ref 0.1–1.0)
MONOS PCT: 7 %
NEUTROS ABS: 15.1 10*3/uL — AB (ref 1.7–7.7)
Neutrophils Relative %: 86 %
Platelets: 476 10*3/uL — ABNORMAL HIGH (ref 150–400)
RBC: 3.17 MIL/uL — ABNORMAL LOW (ref 3.87–5.11)
RDW: 15.9 % — AB (ref 11.5–15.5)
WBC: 17.6 10*3/uL — ABNORMAL HIGH (ref 4.0–10.5)

## 2016-02-18 LAB — FERRITIN: FERRITIN: 810 ng/mL — AB (ref 11–307)

## 2016-02-18 LAB — FIBRINOGEN: Fibrinogen: >800 mg/dL — ABNORMAL HIGH (ref 210–475)

## 2016-02-18 LAB — APTT: aPTT: 37 seconds — ABNORMAL HIGH (ref 24–36)

## 2016-02-18 LAB — IRON AND TIBC
IRON: 12 ug/dL — AB (ref 28–170)
SATURATION RATIOS: 5 % — AB (ref 10.4–31.8)
TIBC: 244 ug/dL — AB (ref 250–450)
UIBC: 232 ug/dL

## 2016-02-18 LAB — MAGNESIUM: MAGNESIUM: 2.4 mg/dL (ref 1.7–2.4)

## 2016-02-18 LAB — PROTIME-INR
INR: 1.11
Prothrombin Time: 14.4 seconds (ref 11.4–15.2)

## 2016-02-18 LAB — URINE CULTURE: CULTURE: NO GROWTH

## 2016-02-18 MED ORDER — SODIUM CHLORIDE 0.9 % IV SOLN
510.0000 mg | Freq: Once | INTRAVENOUS | Status: AC
  Administered 2016-02-18: 510 mg via INTRAVENOUS
  Filled 2016-02-18: qty 17

## 2016-02-18 MED ORDER — FENTANYL CITRATE (PF) 100 MCG/2ML IJ SOLN
INTRAMUSCULAR | Status: AC
  Administered 2016-02-18: 100 ug
  Filled 2016-02-18: qty 2

## 2016-02-18 NOTE — Progress Notes (Signed)
eLink Physician-Brief Progress Note Patient Name: Samantha Pittman DOB: July 27, 1975 MRN: [id number]   Date of Service  02/18/2016  HPI/Events of Note  Vent dyssynchrony  eICU Interventions  Changed to PCV mode - looks better     Intervention Category Major Interventions: Respiratory failure - evaluation and management  Wilhelmina Mcardle 02/18/2016, 6:02 PM

## 2016-02-18 NOTE — Progress Notes (Addendum)
STROKE TEAM PROGRESS NOTE   SUBJECTIVE (INTERVAL HISTORY) No family is at bedside. MRI repeat yesterday continued to show embolic stroke pattern but no significant change from last MRI. However, the brainstem edema improved from last MRI. Pt has not been on continuous sedation, however, minimal response for pain stimulation today. Discuss with Dr. Tami Ribas, agree with poor prognosis and recommend palliative care.    OBJECTIVE Temp:  [99.2 F (37.3 C)-101.6 F (38.7 C)] 101.2 F (38.4 C) (11/07 1128) Pulse Rate:  [103-124] 118 (11/07 1500) Cardiac Rhythm: Sinus tachycardia (11/07 1200) Resp:  [12-22] 19 (11/07 1500) BP: (101-157)/(67-111) 127/84 (11/07 1500) SpO2:  [95 %-100 %] 99 % (11/07 1500) FiO2 (%):  [40 %] 40 % (11/07 1207) Weight:  [155 lb 13.8 oz (70.7 kg)-159 lb 2.8 oz (72.2 kg)] 157 lb 3 oz (71.3 kg) (11/07 0400)  CBC:   Recent Labs Lab 02/17/16 0248 02/18/16 0228  WBC 14.7* 17.6*  NEUTROABS 12.1* 15.1*  HGB 8.3* 9.3*  HCT 25.7* 28.2*  MCV 88.0 89.0  PLT 387 476*    Basic Metabolic Panel:   Recent Labs Lab 02/16/16 1600 02/17/16 0248 02/18/16 0228  NA  --  138 137  K  --  3.8 3.9  CL  --  97* 95*  CO2  --  26 26  GLUCOSE  --  111* 114*  BUN  --  80* 58*  CREATININE  --  5.69* 4.05*  CALCIUM  --  8.4* 8.9  MG 2.3  --  2.4  PHOS  --  6.3* 5.6*    Lipid Panel:     Component Value Date/Time   CHOL 183 02/13/2016 0215   TRIG 83 02/13/2016 0215   HDL 66 02/13/2016 0215   CHOLHDL 2.8 02/13/2016 0215   VLDL 17 02/13/2016 0215   LDLCALC 100 (H) 02/13/2016 0215   HgbA1c:  Lab Results  Component Value Date   HGBA1C 4.7 (L) 02/13/2016   Urine Drug Screen:     Component Value Date/Time   LABOPIA NONE DETECTED 02/12/2016 1319   COCAINSCRNUR NONE DETECTED 02/12/2016 1319   LABBENZ NONE DETECTED 02/12/2016 1319   AMPHETMU NONE DETECTED 02/12/2016 1319   THCU NONE DETECTED 02/12/2016 1319   LABBARB NONE DETECTED 02/12/2016 1319    IMAGING I have  personally reviewed the radiological images below and agree with the radiology interpretations.  Ct Head Wo Contrast 02/16/2016 IMPRESSION: Increased edema at sites of multiple acute infarcts, as described above. Mild mass effect on right frontal horn, without midline shift or hydrocephalus. No evidence of intracranial hemorrhage.    Dg Chest Port 1 View 02/17/2016 IMPRESSION: 1. Lines and tubes in stable position. NG tube tip is just below left hemidiaphragm. Advancement of approximately 10 cm should be considered. 2. Stable cardiomegaly. 3.  Mild right base subsegmental atelectasis. These results will be called to the ordering clinician or representative by the Radiologist Assistant, and communication documented in the PACS or zVision Dashboard.   MRI/MRA  02/12/2016 1. Extensive edema affecting the bilateral pons and medulla with associated brainstem swelling. No associated hemorrhage. 2. 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 No associated hemorrhage or mass effect. 3. Intracranial MRA is negative for emergent large vessel occlusion. There is at most mild irregularity of the bilateral PCAs and the right ACA.   EEG  This EEG is abnormal with mild generalized nonspecific continuous slowing of cerebral activity  which can be seen with sedating medications as well as metabolic and toxic encephalopathies. No evidence of epileptiform activity was recorded.  CUS Could not obtain images from right carotid due to poor patient position with head turned completely to the right. Tried to reposition patient multiple times but she kept head firmly to the right. Left :No significant (1-39%) ICA stenosis. Antegrade vertebral flow.  Echocardiogram 02/13/2016 Left ventricle:  The cavity size was normal. There was moderate concentric hypertrophy.  Systolic function was  vigorous. The estimated ejection fraction was in the range of 65% to 70%.  Wall motion was normal; there were no regional wall motion  Mr Brain Wo Contrast 02/17/2016 IMPRESSION: Multiple areas of acute infarct are similar to the recent MRI. In addition, there may be a small area of infarct in the posterior spinal cord at the C1 level which is similar to the prior study. Cannot completely exclude artifact. There is a new area of patchy restricted diffusion in the right occipital white matter compatible with interval new infarct. Interval improvement in diffuse edema in the pons. Differential diagnosis includes posterior reversible encephalopathy syndrome and osmotic demyelinization.   EEG  diffuse cerebral dysfunction that is non-specific in etiology and can be seen with hypoxic/ischemic injury, toxic/metabolic encephalopathies, or medication effect.  The absence of epileptiform discharges does not rule out a clinical diagnosis of epilepsy.  Clinical correlation is advised.  LE venous doppler There is no evidence of deep or superficial vein thrombosis involving the right and left lower extremities. All visualized vessels appear patent and compressible. There is no evidence of Baker's cysts bilaterally.   PHYSICAL EXAM  Temp:  [99.2 F (37.3 C)-101.6 F (38.7 C)] 101.2 F (38.4 C) (11/07 1128) Pulse Rate:  [103-124] 118 (11/07 1500) Resp:  [12-22] 19 (11/07 1500) BP: (101-157)/(67-111) 127/84 (11/07 1500) SpO2:  [95 %-100 %] 99 % (11/07 1500) FiO2 (%):  [40 %] 40 % (11/07 1207) Weight:  [155 lb 13.8 oz (70.7 kg)-159 lb 2.8 oz (72.2 kg)] 157 lb 3 oz (71.3 kg) (11/07 0400)  General - Well nourished, well developed, intubated.  Ophthalmologic - Fundi not visualized due to noncooperation.  Cardiovascular - Regular rate and rhythm.  Neuro - intubated, not responsive to commands. Eye closed and pupil 35mm bilaterally and not respond to light. Doll's eyes present but sluggish, corneal and gag  present. On pain stimulation, trace withdraw in all extremities, RLE>LLE, RUE=LUE. B/l babinski negative, DTR 1+ to diminished. Sensation, coordination and gait not tested.    ASSESSMENT/PLAN Ms. Pearson Medaris is a 40 y.o. female with history of HTN and seizures presenting to outlying hospital with hypertensive emergency followed by seizure. She did not present with stroke symptoms.   Stroke: bilateral supra and infratentorial scattered acute infarcts, embolic pattern with unknown source. Hypercoagulable state due to malignancy or sepsis in possible.  CT at outside hospital with R caudate lacunar infarct  MRI - scattered acute infarcts bilateral anterior and posterior circulation.  MRA - negative for emergent large vessel occlusion  Carotid Doppler unremarkable   2D Echo - EF 65-70%. No cardiac source of emboli identified.  LE venous doppler neg for DVT  LDL  100  HgbA1c 4.7  Heparin subq for VTE prophylaxis Diet NPO time specified, on TF  No antithrombotic prior to admission, now on ASA 325mg .  Ongoing aggressive stroke risk factor management  Therapy recommendations:  pending  Disposition:  pending   Brainstem edema - ? Atypical PRES due to hypertensive emergency  MRI brain - extensive brainstem edema  Repeat MRI improved edema  BP control - add po metoprolol  BP goal < 160  Seizure   Not related to stroke seen on CT  EEG generalized nonspecific slowing  Continue trileptal 300 bid  Had new decerebrate posturing 02/16/16  Repeat EEG diffuse slowing no seizure  Respiratory failure  Intubated after seizure for airway protection  CCM on board  AKI  Cre worsened and needed HD  Cre 11.56->7.50->5.69->4.05  Nephrology on board  ? Sepsis  Febrile Tmax 101.6   1/4 BCx positive, repeat BCx NGTD  ID on board, consider contamination for B Cx  Breast wound could be the source of infection  On augmentin  Left breast cancer  Ulcerating  lesion  Biopsy confirmed carcinoma  Would care  Oncology on board, not recommend systemic treatment, and recommend palliative care  Other Active Problems  Mild anemia 7.4->6.7->8.3->9.3  Leukocytosis 15.3->13.8->14.7->17.6  Hospital day # 7   This patient is critically ill due to embolic stroke, likely PRES involving brainstem edema, AKI needing HD, sepsis and respiratory failure, seizure as well as breast cancer and at significant risk of neurological worsening, death form recurrent stroke, hemorrhagic transformation, kidney failure, heart failure. This patient's care requires constant monitoring of vital signs, hemodynamics, respiratory and cardiac monitoring, review of multiple databases, neurological assessment, discussion with family, other specialists and medical decision making of high complexity. I discussed with Dr. Tami Ribas and agree with palliative care due to poor prognosis. I spent 40 minutes of neurocritical care time in the care of this patient.   Rosalin Hawking, MD PhD Stroke Neurology 02/18/2016 3:53 PM

## 2016-02-18 NOTE — Progress Notes (Signed)
PULMONARY / CRITICAL CARE MEDICINE   Name: Samantha Pittman MRN: [id number] DOB: Aug 08, 1975    ADMISSION DATE:  01/29/2016 CONSULTATION DATE:  02/07/2016  REFERRING MD:  Dr, Langley Gauss / [facility]   CHIEF COMPLAINT:  Weakness / Dizziness   BRIEF:  40 y/o F with PMH of tubal ligation, HTN,seizures who presented to [facility] on 10/30 with a 6 day history of weakness, dizziness and difficulty seeing out of her left eye.    Initial evaluation found the patient to be significantly hypertensive with presenting pressure of 224/160 (MAP 81).  Initial labs Na 135, K 3.6, Cl 93, AG 24, BUN 36, sr cr 2.94, glucose 184, mag 2.5, AST 31/ALT 14, alk phos 136, TSH 0.37, WBC 11.3, Hgb 11, and platelets 51.  UA was negative.  She was treated with captopril, ativan, amlodipine and labetalol to reduce blood pressure.  The patient was admitted to [facility] for further evaluation.  She later had a seizure.  Per report, she was felt to be post ictal while on the medical floor.  Am of 10/31, she remained altered and pupils were unequal.  Follow up CT of the head was obtained which demonstrated an abnormal new hypodensity in the head of the right caudate nucleus extending into the right lentiform nucleus, acute or early subacute infarct in this vicinity.  The patient was intubated and transferred to [facility] for further evaluation.        LINES/TUBES: OETT 10/31 >>  OGT 10/31 >> FOLEY 10/31 >> PIV x2  SIGNIFICANT EVENTS: 10/30 - Admit to [facility] with weakness, dizziness.  Hypertensive emergency.  Seizure > AMS,  10/31 - AMS continued, CT head worrisome for CVA, transferred to [facility]. EEG 10/31:  Mild generalized nonspecific continuous slowing of cerebral activity which can be seen with sedating medications as well as metabolic and toxic encephalopathies. No evidence of epileptiform activity was recorded.  11/1: MRI findings perhaps an usual appearance of Severe Posterior Reversible  Encephalopathy Syndrome.  11/2: Sepsis protocol initiated for fevers, elevated WBC, and elevated procalcitonin. ECHO with EF 65-70% and mildly increased pulmonary artery systolic pressure. Biopsy left breast mass 02/14/16 - Worsening of Cr and Oliguria. Lasix given. Still very obtunded. Per RN, only grimaces with oral care otherwise not very responsive. Start HD 02/15/16 - Making urine. Renal s/p HD . Remains unresponsive - doing PSV. 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 CT head with increased edema at sites of multiple acute infarcts. HD completed. S/p 1uPRBC.  02/17/16: Repeat MRI ordered due to decerebrate posturing which showed new infarct and improvement in diffuse edema in the pons. EEG with mild to moderate diffuse slowing of the background.    SUBJECTIVE/OVERNIGHT/INTERVAL HX 02/18/16: Febrile overnight with Tmax 101.4. Still comatose.   VITAL SIGNS: BP 122/84 (BP Location: Right Arm)   Pulse (!) 123   Temp (!) 101.4 F (38.6 C) (Oral)   Resp 20   Wt 157 lb 3 oz (71.3 kg)   LMP  (LMP Unknown)   SpO2 100%   BMI 25.37 kg/m   HEMODYNAMICS:    VENTILATOR SETTINGS: Vent Mode: PRVC FiO2 (%):  [40 %] 40 % Set Rate:  [12 bmp] 12 bmp Vt Set:  [400 mL] 400 mL PEEP:  [5 cmH20] 5 cmH20 Plateau Pressure:  [6 cmH20-15 cmH20] 9 cmH20  INTAKE / OUTPUT: I/O last 3  completed shifts: In: 9379 [I.V.:250; NG/GT:1425; IV Piggyback:50] Out: 0240 [Urine:110; Other:1500]  PHYSICAL EXAMINATION: General:  Critically ill looking on vent Integument:  Warm & dry. No rash on exposed skin. No bruising. HEENT:  Endotracheal tube in place.  Cardiovascular:  Regular rate. No edema. No appreciable JVD.  Pulmonary:  Clear bilaterally to auscultation. Symmetric chest wall rise on ventilator. Abdomen: Soft. Normal bowel sounds.  Nondistended.  Musculoskeletal:  No joint deformity or effusion appreciated. Neurological:  No withdrawal to pain in extremities. Withdraws to sternal rub. No spontaneous movements. No response to voice. RASS -5.  LABS:  PULMONARY  Recent Labs Lab 01/18/2016 1625 02/13/16 1955  PHART 7.392 7.323*  PCO2ART 29.6* 32.1  PO2ART 169* 83.0  HCO3 17.6* 16.7*  TCO2  --  18  O2SAT 99.4 96.0    CBC  Recent Labs Lab 02/16/16 0511 02/16/16 1607 02/17/16 0248 02/18/16 0228  HGB 6.7* 8.3* 8.3* 9.3*  HCT 20.2* 24.8* 25.7* 28.2*  WBC 13.8*  --  14.7* 17.6*  PLT 357  --  387 476*    COAGULATION  Recent Labs Lab 02/14/16 0238 02/15/16 0229 02/16/16 0511 02/17/16 0248 02/18/16 0228  INR 1.27 1.54 1.35 1.15 1.11    CARDIAC    Recent Labs Lab 01/27/2016 2225 02/12/16 0444 02/12/16 1034 02/12/16 1701 02/12/16 2215  TROPONINI 1.88* 2.04* 1.97* 1.35* 2.23*   No results for input(s): PROBNP in the last 168 hours.   CHEMISTRY  Recent Labs Lab 02/14/16 0238 02/15/16 0229 02/15/16 1025 02/15/16 1430 02/16/16 0511 02/16/16 1600 02/17/16 0248 02/18/16 0228  NA 139 141  --   --  139  --  138 137  K 5.3* 5.7*  --   --  4.0  --  3.8 3.9  CL 107 105  --   --  97*  --  97* 95*  CO2 16* 16*  --   --  26  --  26 26  GLUCOSE 108* 109*  --   --  133*  --  111* 114*  BUN 110* 146*  --   --  102*  --  80* 58*  CREATININE 9.56* 11.56*  --   --  7.50*  --  5.69* 4.05*  CALCIUM 8.8* 8.7*  --   --  8.0*  --  8.4* 8.9  MG 2.6* 2.9* 3.0* 3.1* 2.8* 2.3  --  2.4  PHOS 10.6* 13.2*  --   --  8.4*  --  6.3* 5.6*   CrCl cannot be calculated (Unknown ideal weight.).   LIVER  Recent Labs Lab 01/19/2016 1711  02/13/16 1711 02/14/16 0238 02/15/16 0229 02/16/16 0511 02/17/16 0248 02/18/16 0228  AST 29  --  210*  --  33  --   --   --   ALT 18  --  271*  --  178*  --   --   --   ALKPHOS 100  --  115  --  114  --   --   --   BILITOT 0.4  --  0.6  --  0.8  --   --   --   PROT 6.3*   --  5.5*  --  6.8  --   --   --   ALBUMIN 3.1*  < > 2.4* 2.5* 2.5*  2.5* 2.1* 2.3* 2.5*  INR 1.17  1.22  < >  --  1.27 1.54 1.35 1.15 1.11  < > = values in this interval not displayed.   INFECTIOUS  Recent Labs Lab 02/12/16 1825 02/13/16 0215 02/13/16 1711 02/14/16 0238  LATICACIDVEN  --   --  1.0  --   PROCALCITON 0.73 8.77  --  18.68     ENDOCRINE CBG (last 3)   Recent Labs  02/17/16 2056 02/17/16 2358 02/18/16 0358  GLUCAP 118* 116* 119*         IMAGING x48h  - image(s) personally visualized  -   highlighted in bold Ct Head Wo Contrast  Result Date: 02/16/2016 CLINICAL DATA:  Followup acute cerebral infarcts. EXAM: CT HEAD WITHOUT CONTRAST TECHNIQUE: Contiguous axial images were obtained from the base of the skull through the vertex without intravenous contrast. COMPARISON:  Brain MRI on 02/12/2016 and head CT on 01/22/2016 FINDINGS: Brain: No evidence of intracranial hemorrhage. Increased edema is seen at sites of acute infarcts involving the bilateral basal ganglia, right thalamus and bilateral deep periventricular white matter. Increased edema is also seen at sites of acute infarcts involving the right occipital lobe and left cerebellum. Diffuse edema is again seen involving the pons and medulla. Mild mass effect is seen on the right frontal horn, however there is no evidence of midline shift. No evidence hydrocephalus. Vascular: No hyperdense vessel or unexpected calcification. Skull: Normal. Negative for fracture or focal lesion. Sinuses/Orbits: No acute finding. Other: None. IMPRESSION: Increased edema at sites of multiple acute infarcts, as described above. Mild mass effect on right frontal horn, without midline shift or hydrocephalus. No evidence of intracranial hemorrhage. Electronically Signed   By: Earle Gell M.D.   On: 02/16/2016 16:22   Mr Brain Wo Contrast  Result Date: 02/17/2016 CLINICAL DATA:  Stroke.  Hypertension.  Breast cancer. EXAM: MRI HEAD  WITHOUT CONTRAST TECHNIQUE: Multiplanar, multiecho pulse sequences of the brain and surrounding structures were obtained without intravenous contrast. COMPARISON:  MRI 02/12/2016 FINDINGS: Brain: Restricted diffusion ni the cerebellar hemispheres left greater than right is unchanged compatible with acute infarction. Multiple areas of restricted diffusion are present in the basal ganglia right greater than left similar to the prior study compatible with acute infarct. Multiple small areas of restricted diffusion in the cerebral white matter bilaterally are mostly the same however there is a new area of restricted diffusion in the right occipital white matter which is relatively small. This is consistent with a new area of acute infarct. Hyperintense signal on diffusion in the posterior spinal cord at the C1 level is seen on both axial and coronal images including repeat thin section imaging and this may be due to cord infarct. This is unchanged from the prior MRI. Hyperintensity in the pons has improved in the interval . Decrease swelling and mass-effect on the pons. Ventricle size remains normal. Negative for hemorrhage or mass. No shift of the midline structures. Negative for hydrocephalus. Vascular: Normal arterial flow void. Skull and upper cervical spine: Negative Sinuses/Orbits: Mild mucosal edema in the paranasal sinuses. Normal orbit. Other: None IMPRESSION: Multiple areas of acute infarct are similar to the recent MRI. In addition, there may be a small area of infarct in the posterior spinal cord at the C1 level which is similar to the prior study. Cannot completely exclude artifact. There is a new area of patchy restricted diffusion in the right occipital white matter compatible with interval new infarct. Interval improvement in diffuse edema in the pons. Differential diagnosis includes posterior reversible encephalopathy syndrome and osmotic demyelinization. Electronically Signed   By: Franchot Gallo M.D.    On: 02/17/2016 11:56   Dg Chest Port 1  View  Result Date: 02/17/2016 CLINICAL DATA:  Shortness of breath. EXAM: PORTABLE CHEST 1 VIEW COMPARISON:  02/16/2016. FINDINGS: Endotracheal tube right IJ line stable position. NG tube in stable position with tip just under the left hemidiaphragm. Advancement of the NG tube approximately 10 cm should be considered. Low lung volumes with mild right base subsegmental atelectasis . No pleural effusion or pneumothorax. Stable cardiomegaly. No pulmonary venous congestion. No acute bony abnormality . IMPRESSION: 1. Lines and tubes in stable position. NG tube tip is just below left hemidiaphragm. Advancement of approximately 10 cm should be considered. 2. Stable cardiomegaly. 3.  Mild right base subsegmental atelectasis. These results will be called to the ordering clinician or representative by the Radiologist Assistant, and communication documented in the PACS or zVision Dashboard. Electronically Signed   By: Marcello Moores  Register   On: 02/17/2016 07:11       ASSESSMENT / PLAN:  NEUROLOGIC A:   Acute Encephalopathy - Unclear etiology. CVA vs PRES vs TTP. 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 MRI with extensive edema without hemorrhage. Superimposed scattered acute infarcts.  Seizure - Occurred w/ correction of hypertensive urgency/emergency. H/O Seizure Disorder   - still unresponsive/comatose . Possible decerebrate posturing. CT head with increased edema at sites of multiple acute infarcts. MRI with new infarct. EEG with mild to moderate slowing of background without epileptiform activity.   P:   MRI brain pending  RASS goal: 0  Neurology Consulted & following Seizure Precautions AEDs per Neurology:  Trileptal avopid benzo and opioids   CARDIOVASCULAR A:  Hypertensive Emergency - Initially.   Elevated Troponin I - Likely subendocardial ischemia. H/O HTN - No medications as an outpatient.  P:  Continuous Telemetry Monitoring Vitals per Unit  Protocol Goal SBP >95 Hydralazine prn  Metoprolol 25 mg BID    RENAL A:   Acute Renal Failure - Most likely ischemic ATN. /multifactorial - sepsis, ACe inhibitor, high bp   -now requiring HD and anuric, Cr improving    P:   HD per renal     HEMATOLOGIC/ONCOLOGIC A:   Anemia - No signs of active bleeding. Prob due to sepsis. S/p 1uPRBC.  Thrombocytopenia, Resolved    - Schistocytes on smear. 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   P:   Full Vent Support Intermittent ABG & Portable CXR Weaning FiO2 for Sat >92% Albuterol neb prn  GASTROINTESTINAL A:   No acute issues.  P:   NPO Pepcid IV q24hr  Tube feedings   INFECTIOUS  MICROBIOLOGY: MRSA PCR 10/31:  Negative Blood Cx 11/1: Strep viridans  Breast Wound Culture: Normal skin flora  Hep B negative    A:   Strep viridans bacteremia  - from admit 02/12/2016. ID suspect contaminant Fungating left breast mass   -11/7: Still with fevers; Trach aspirate culture with rare GPC in pairs   P:   Augmentin (Day 5/7)  Monitor re-culture results    ENDOCRINE A:   No acute issues.   P:   Monitor glucose with daily labs.   FAMILY  - Updates: No family at bedside   Phill Myron, [place]. 02/18/2016, 7:33 AM PGY-2, [place]  Attending:  I have seen and examined the patient with nurse practitioner/resident and agree with the note above.  We formulated the plan together and I elicited the following history.    Fevers overnight MRI brain yesterday> ? New infarct C1, scattered infarcts noted, improvement in pons edema EEG> no epileptiform activity   On exam No response to  external stimuli for me Tachypnea on vent tachycardic     Acute encphalopathy > overall picture appears to have a poor prognosis.  She has scattered areas of infarct which I believe cause her encephalopathy and are unlikely to improve.  If we were to press forward I would consider an LP to assess for infectious and malignant/parapneoplastic syndrome diagnoses, possibly a TEE to assess for non-infective endocarditis and possibly empiric plasmapheresis.  However I worry that this will be ineffective given the extent of injury on her MRI brain.  Will discuss with neurology and oncology today.  I think we may need to withdraw care but will discuss with those teams first.  Rest as above.  Family (mother, daughter) updated 11/7  CC time 31 minutes  [name], MD [place] [facility] Pager: [phone] Cell: [phone] After 3pm or if no response, call [phone]

## 2016-02-18 NOTE — Progress Notes (Signed)
Patient ID: Samantha Pittman, female   DOB: 1976-01-11, 40 y.o.   MRN: [id number]   [facility] Progress Note      Subjective:    Continued posturing EEG no epileptiform activity Had HD 2 liters off no problems Has temp HD cath R IJ since 11/4 Minimal UOP   Objective:   BP 122/84 (BP Location: Right Arm)   Pulse (!) 123   Temp (!) 101.4 F (38.6 C) (Oral)   Resp 20   Wt 71.3 kg (157 lb 3 oz)   LMP  (LMP Unknown)   SpO2 100%   BMI 25.37 kg/m   Intake/Output Summary (Last 24 hours) at 02/18/16 0708 Last data filed at 02/18/16 0600  Gross per 24 hour  Intake              950 ml  Output             1575 ml  Net             -625 ml   Weight change: -1.3 kg (-2 lb 13.9 oz)  Physical Exam: Intubated, unrespn R IJ HD cath (11/4) Regular tachycardia Dynamic precordium S1 and S2 2/6 systolic murmur Clear to auscultation bilaterally, no rales Abd: Soft, flat, nontender No edema. Legs immobilized.  Imaging: Ct Head Wo Contrast  Result Date: 02/16/2016 CLINICAL DATA:  Followup acute cerebral infarcts. EXAM: CT HEAD WITHOUT CONTRAST TECHNIQUE: Contiguous axial images were obtained from the base of the skull through the vertex without intravenous contrast. COMPARISON:  Brain MRI on 02/12/2016 and head CT on 01/14/2016 FINDINGS: Brain: No evidence of intracranial hemorrhage. Increased edema is seen at sites of acute infarcts involving the bilateral basal ganglia, right thalamus and bilateral deep periventricular white matter. Increased edema is also seen at sites of acute infarcts involving the right occipital lobe and left cerebellum. Diffuse edema is again seen involving the pons and medulla. Mild mass effect is seen on the right frontal horn, however there is no evidence of midline shift. No evidence hydrocephalus. Vascular: No hyperdense vessel or unexpected calcification. Skull: Normal. Negative for fracture or focal lesion. Sinuses/Orbits: No acute finding. Other:  None. IMPRESSION: Increased edema at sites of multiple acute infarcts, as described above. Mild mass effect on right frontal horn, without midline shift or hydrocephalus. No evidence of intracranial hemorrhage. Electronically Signed   By: Earle Gell M.D.   On: 02/16/2016 16:22   Mr Brain Wo Contrast  Result Date: 02/17/2016 CLINICAL DATA:  Stroke.  Hypertension.  Breast cancer. EXAM: MRI HEAD WITHOUT CONTRAST TECHNIQUE: Multiplanar, multiecho pulse sequences of the brain and surrounding structures were obtained without intravenous contrast. COMPARISON:  MRI 02/12/2016 FINDINGS: Brain: Restricted diffusion ni the cerebellar hemispheres left greater than right is unchanged compatible with acute infarction. Multiple areas of restricted diffusion are present in the basal ganglia right greater than left similar to the prior study compatible with acute infarct. Multiple small areas of restricted diffusion in the cerebral white matter bilaterally are mostly the same however there is a new area of restricted diffusion in the right occipital white matter which is relatively small. This is consistent with a new area of acute infarct. Hyperintense signal on diffusion in the posterior spinal cord at the C1 level is seen on both axial and coronal images including repeat thin section imaging and this may be due to cord infarct. This is unchanged from the prior MRI. Hyperintensity in the pons has improved in the interval . Decrease swelling and mass-effect  on the pons. Ventricle size remains normal. Negative for hemorrhage or mass. No shift of the midline structures. Negative for hydrocephalus. Vascular: Normal arterial flow void. Skull and upper cervical spine: Negative Sinuses/Orbits: Mild mucosal edema in the paranasal sinuses. Normal orbit. Other: None IMPRESSION: Multiple areas of acute infarct are similar to the recent MRI. In addition, there may be a small area of infarct in the posterior spinal cord at the C1 level  which is similar to the prior study. Cannot completely exclude artifact. There is a new area of patchy restricted diffusion in the right occipital white matter compatible with interval new infarct. Interval improvement in diffuse edema in the pons. Differential diagnosis includes posterior reversible encephalopathy syndrome and osmotic demyelinization. Electronically Signed   By: Franchot Gallo M.D.   On: 02/17/2016 11:56   Dg Chest Port 1 View  Result Date: 02/17/2016 CLINICAL DATA:  Shortness of breath. EXAM: PORTABLE CHEST 1 VIEW COMPARISON:  02/16/2016. FINDINGS: Endotracheal tube right IJ line stable position. NG tube in stable position with tip just under the left hemidiaphragm. Advancement of the NG tube approximately 10 cm should be considered. Low lung volumes with mild right base subsegmental atelectasis . No pleural effusion or pneumothorax. Stable cardiomegaly. No pulmonary venous congestion. No acute bony abnormality . IMPRESSION: 1. Lines and tubes in stable position. NG tube tip is just below left hemidiaphragm. Advancement of approximately 10 cm should be considered. 2. Stable cardiomegaly. 3.  Mild right base subsegmental atelectasis. These results will be called to the ordering clinician or representative by the Radiologist Assistant, and communication documented in the PACS or zVision Dashboard. Electronically Signed   By: Marcello Moores  Register   On: 02/17/2016 07:11     Recent Labs Lab 02/12/16 0444 02/13/16 0215 02/13/16 1711 02/14/16 0238 02/15/16 0229 02/16/16 0511 02/17/16 0248 02/18/16 0228  NA 138 138 140 139 141 139 138 137  K 4.8 5.2* 5.3* 5.3* 5.7* 4.0 3.8 3.9  CL 107 105 110 107 105 97* 97* 95*  CO2 18* 18* 15* 16* 16* 26 26 26   GLUCOSE 115* 131* 112* 108* 109* 133* 111* 114*  BUN 64* 85* 103* 110* 146* 102* 80* 58*  CREATININE 7.13* 8.60* 9.54* 9.56* 11.56* 7.50* 5.69* 4.05*  CALCIUM 8.4* 8.5* 8.2* 8.8* 8.7* 8.0* 8.4* 8.9  PHOS 6.9* 8.6*  --  10.6* 13.2* 8.4* 6.3*  5.6*     Recent Labs Lab 02/15/16 0229 02/15/16 1430 02/16/16 0511 02/16/16 1607 02/17/16 0248 02/18/16 0228  WBC 13.3* 15.3* 13.8*  --  14.7* 17.6*  NEUTROABS 11.8*  --  12.3*  --  12.1* 15.1*  HGB 7.8* 7.4* 6.7* 8.3* 8.3* 9.3*  HCT 23.9* 22.4* 20.2* 24.8* 25.7* 28.2*  MCV 88.8 89.2 86.7  --  88.0 89.0  PLT 325 338 357  --  387 476*    Medications:    . sodium chloride   Intravenous Once  . amoxicillin-clavulanate  500 mg of amoxicillin Oral Q24H  . aspirin EC  325 mg Oral Daily  . chlorhexidine  15 mL Mouth Rinse BID  . famotidine (PEPCID) IV  20 mg Intravenous Q24H  . feeding supplement (NEPRO CARB STEADY)  1,000 mL Per Tube Q24H  . feeding supplement (PRO-STAT SUGAR FREE 64)  60 mL Per Tube TID  . mouth rinse  15 mL Mouth Rinse 10 times per day  . metoprolol tartrate  25 mg Oral BID  . OXcarbazepine  900 mg Per Tube BID   . sodium chloride  10 mL/hr at 02/17/16 2350   Background:  40 y.o. female with CVA/PRES, AKI d/t ischemic ATN requiring dialysis (initiated 02/16/16). Baseline creatinine not known - 2.64 on admission to outside hospital.    Assessment/ Plan:    1.  Acute renal failure:  Suspect hemodynamically mediated with ischemic ATN. Making little urine. Has had HD X2 with marked improvement in azotemia. Will continue HD until evidence for renal recovery. Baseline creatinine not known. No HD indications for today. Monitor daily.  2. Metabolic acidosis -corrected.  3. Malignant hypertension: metoprolol + prn hydralazine. 4. Anemia/thrombocytopenia: s/p 1 unit with HD 11/5 for Hb 6.7. Hb up to 9.3. 8.3 Iron deficiency with tsat of 5. Feraheme.  5. 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 6. Seizure disorder/altered mentation:  MRI suspicious for PRES. CT increased edema at sites of multiple acute infarcts. Repeat MRI today 11/6 new areas of infarct, Remains unresponsive.  Decerebrate posturing. EEG no sz activity. Neuro following. 7. Sepsis: 1 out of 4 blood cultures positive for strep. Source suspected to be from her fungating breast mass. Zosyn and vancomycin-->augmentin. ID has signed off.  [name], MD [facility] [phone] Pager 02/18/2016, 7:08 AM

## 2016-02-18 NOTE — Progress Notes (Signed)
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 Whether local therapy such as lumpectomy/radiation therapy would bring any meaningful benefit is questionable at this point. I recommend palliative care consult to address goals of care This recommendation is discussed with primary service I will sign off. Please call if questions arise

## 2016-02-18 NOTE — Progress Notes (Signed)
**  Preliminary report by tech**  Bilateral lower extremity venous duplex completed. There is no evidence of deep or superficial vein thrombosis involving the right and left lower extremities. All visualized vessels appear patent and compressible. There is no evidence of Baker's cysts bilaterally.  02/18/16 11:52 AM Carlos Levering RVT

## 2016-02-19 ENCOUNTER — Inpatient Hospital Stay (HOSPITAL_COMMUNITY): Payer: Medicaid Other

## 2016-02-19 LAB — GLUCOSE, CAPILLARY
GLUCOSE-CAPILLARY: 98 mg/dL (ref 65–99)
GLUCOSE-CAPILLARY: 103 mg/dL — AB (ref 65–99)
Glucose-Capillary: 101 mg/dL — ABNORMAL HIGH (ref 65–99)
Glucose-Capillary: 103 mg/dL — ABNORMAL HIGH (ref 65–99)
Glucose-Capillary: 105 mg/dL — ABNORMAL HIGH (ref 65–99)
Glucose-Capillary: 108 mg/dL — ABNORMAL HIGH (ref 65–99)

## 2016-02-19 LAB — RENAL FUNCTION PANEL
Albumin: 2.2 g/dL — ABNORMAL LOW (ref 3.5–5.0)
Anion gap: 19 — ABNORMAL HIGH (ref 5–15)
BUN: 135 mg/dL — AB (ref 6–20)
CALCIUM: 8.9 mg/dL (ref 8.9–10.3)
CHLORIDE: 97 mmol/L — AB (ref 101–111)
CO2: 21 mmol/L — ABNORMAL LOW (ref 22–32)
CREATININE: 7.81 mg/dL — AB (ref 0.44–1.00)
GFR calc Af Amer: 7 mL/min — ABNORMAL LOW (ref >60)
GFR, EST NON AFRICAN AMERICAN: 6 mL/min — AB (ref >60)
Glucose, Bld: 104 mg/dL — ABNORMAL HIGH (ref 65–99)
Phosphorus: 10.4 mg/dL — ABNORMAL HIGH (ref 2.5–4.6)
Potassium: 4 mmol/L (ref 3.5–5.1)
SODIUM: 137 mmol/L (ref 135–145)

## 2016-02-19 LAB — CULTURE, RESPIRATORY W GRAM STAIN

## 2016-02-19 LAB — CULTURE, RESPIRATORY

## 2016-02-19 LAB — CBC WITH DIFFERENTIAL/PLATELET
BASOS PCT: 0 %
Basophils Absolute: 0 10*3/uL (ref 0.0–0.1)
EOS PCT: 0 %
Eosinophils Absolute: 0 10*3/uL (ref 0.0–0.7)
HCT: 24.3 % — ABNORMAL LOW (ref 36.0–46.0)
Hemoglobin: 8 g/dL — ABNORMAL LOW (ref 12.0–15.0)
LYMPHS ABS: 1.7 10*3/uL (ref 0.7–4.0)
Lymphocytes Relative: 11 %
MCH: 29.1 pg (ref 26.0–34.0)
MCHC: 32.9 g/dL (ref 30.0–36.0)
MCV: 88.4 fL (ref 78.0–100.0)
MONO ABS: 1.4 10*3/uL — AB (ref 0.1–1.0)
Monocytes Relative: 9 %
Neutro Abs: 12.2 10*3/uL — ABNORMAL HIGH (ref 1.7–7.7)
Neutrophils Relative %: 80 %
PLATELETS: 443 10*3/uL — AB (ref 150–400)
RBC: 2.75 MIL/uL — AB (ref 3.87–5.11)
RDW: 15.8 % — AB (ref 11.5–15.5)
WBC: 15.3 10*3/uL — AB (ref 4.0–10.5)

## 2016-02-19 LAB — FIBRINOGEN: Fibrinogen: >800 mg/dL — ABNORMAL HIGH (ref 210–475)

## 2016-02-19 LAB — MAGNESIUM: Magnesium: 2.8 mg/dL — ABNORMAL HIGH (ref 1.7–2.4)

## 2016-02-19 LAB — APTT: aPTT: 32 seconds (ref 24–36)

## 2016-02-19 LAB — PROTIME-INR
INR: 1.17
Prothrombin Time: 15 seconds (ref 11.4–15.2)

## 2016-02-19 NOTE — Plan of Care (Signed)
  Interdisciplinary Goals of Care Family Meeting   Date carried out:: 02/19/2016  Location of the meeting: Conference room  Member's involved: Physician, Bedside Registered Nurse, Social Worker and Family Member or next of kin  Durable Power of Attorney or acting medical decision maker: Son and daughter, multiple family members    Discussion: We discussed goals of care for [employer] .  I explained that she has a condition that man cannot cure, specifically her multiple strokes, brainstem swelling in the setting of acute respiratory failure, ESRD and newly diagnosed breast cancer.  I have asked multiple specialists involved to weigh in and we have no treatment options to help her come through this with any chance of meaningful survival. The family voices understanding. Initially her son Berline Chough was upset by this and left our conversation, but he admirably returned and discussed it with me further. He stated that he would like for [place] to continue current care for another 48 hours hoping she will recover, but that they want to make her comfortable if she worsens.   No CPR in the event of cardiac arrest. Withdrawal of care this week if no improvement by Saturday.  Code status: Full DNR  Disposition: Continue current acute care  Time spent for the meeting: 40 minutes  Simonne Maffucci 02/19/2016, 12:43 PM

## 2016-02-19 NOTE — Progress Notes (Addendum)
STROKE TEAM PROGRESS NOTE   SUBJECTIVE (INTERVAL HISTORY) No family is at bedside. Pt no significant change overnight, a little more posturing today on UEs. Dr. Tami Ribas is talking with family regarding palliative care.     OBJECTIVE Temp:  [99.2 F (37.3 C)-102.5 F (39.2 C)] 99.2 F (37.3 C) (11/08 0753) Pulse Rate:  [102-125] 103 (11/08 1100) Cardiac Rhythm: Sinus tachycardia (11/08 0800) Resp:  [12-20] 17 (11/08 1100) BP: (101-148)/(63-99) 136/96 (11/08 1100) SpO2:  [99 %-100 %] 100 % (11/08 1100) FiO2 (%):  [40 %] 40 % (11/08 0827) Weight:  [159 lb 13.3 oz (72.5 kg)] 159 lb 13.3 oz (72.5 kg) (11/08 0337)  CBC:   Recent Labs Lab 02/18/16 0228 02/19/16 0427  WBC 17.6* 15.3*  NEUTROABS 15.1* 12.2*  HGB 9.3* 8.0*  HCT 28.2* 24.3*  MCV 89.0 88.4  PLT 476* 443*    Basic Metabolic Panel:   Recent Labs Lab 02/18/16 0228 02/19/16 0427  NA 137 137  K 3.9 4.0  CL 95* 97*  CO2 26 21*  GLUCOSE 114* 104*  BUN 58* 135*  CREATININE 4.05* 7.81*  CALCIUM 8.9 8.9  MG 2.4 2.8*  PHOS 5.6* 10.4*    Lipid Panel:     Component Value Date/Time   CHOL 183 02/13/2016 0215   TRIG 83 02/13/2016 0215   HDL 66 02/13/2016 0215   CHOLHDL 2.8 02/13/2016 0215   VLDL 17 02/13/2016 0215   LDLCALC 100 (H) 02/13/2016 0215   HgbA1c:  Lab Results  Component Value Date   HGBA1C 4.7 (L) 02/13/2016   Urine Drug Screen:     Component Value Date/Time   LABOPIA NONE DETECTED 02/12/2016 1319   COCAINSCRNUR NONE DETECTED 02/12/2016 1319   LABBENZ NONE DETECTED 02/12/2016 1319   AMPHETMU NONE DETECTED 02/12/2016 1319   THCU NONE DETECTED 02/12/2016 1319   LABBARB NONE DETECTED 02/12/2016 1319    IMAGING I have personally reviewed the radiological images below and agree with the radiology interpretations.  Ct Head Wo Contrast 02/16/2016 IMPRESSION: Increased edema at sites of multiple acute infarcts, as described above. Mild mass effect on right frontal horn, without midline shift or  hydrocephalus. No evidence of intracranial hemorrhage.    Dg Chest Port 1 View 02/17/2016 IMPRESSION: 1. Lines and tubes in stable position. NG tube tip is just below left hemidiaphragm. Advancement of approximately 10 cm should be considered. 2. Stable cardiomegaly. 3.  Mild right base subsegmental atelectasis. These results will be called to the ordering clinician or representative by the Radiologist Assistant, and communication documented in the PACS or zVision Dashboard.   MRI/MRA  02/12/2016 1. Extensive edema affecting the bilateral pons and medulla with associated brainstem swelling. No associated hemorrhage. 2. 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 No associated hemorrhage or mass effect. 3. Intracranial MRA is negative for emergent large vessel occlusion. There is at most mild irregularity of the bilateral PCAs and the right ACA.   EEG  This EEG is abnormal with mild generalized nonspecific continuous slowing of cerebral activity which can be seen with sedating medications as well as metabolic and toxic encephalopathies. No evidence of epileptiform activity was recorded.  CUS Could not obtain images from right carotid due to poor patient position with head turned completely to the right. Tried to reposition patient multiple times but she kept head firmly to the right. Left :No significant (1-39%) ICA stenosis. Antegrade vertebral flow.  Echocardiogram 02/13/2016 Left ventricle:  The cavity size was normal. There was moderate concentric hypertrophy.  Systolic function was vigorous. The estimated ejection fraction was in the range of 65% to 70%.  Wall motion was normal; there were no regional wall motion  Mr Brain Wo Contrast 02/17/2016 IMPRESSION: Multiple areas of acute infarct are similar to the recent MRI. In addition, there may be a small area  of infarct in the posterior spinal cord at the C1 level which is similar to the prior study. Cannot completely exclude artifact. There is a new area of patchy restricted diffusion in the right occipital white matter compatible with interval new infarct. Interval improvement in diffuse edema in the pons. Differential diagnosis includes posterior reversible encephalopathy syndrome and osmotic demyelinization.   EEG  diffuse cerebral dysfunction that is non-specific in etiology and can be seen with hypoxic/ischemic injury, toxic/metabolic encephalopathies, or medication effect.  The absence of epileptiform discharges does not rule out a clinical diagnosis of epilepsy.  Clinical correlation is advised.  LE venous doppler There is no evidence of deep or superficial vein thrombosis involving the right and left lower extremities. All visualized vessels appear patent and compressible. There is no evidence of Baker's cysts bilaterally.   PHYSICAL EXAM  Temp:  [99.2 F (37.3 C)-102.5 F (39.2 C)] 99.2 F (37.3 C) (11/08 0753) Pulse Rate:  [102-125] 103 (11/08 1100) Resp:  [12-20] 17 (11/08 1100) BP: (101-148)/(63-99) 136/96 (11/08 1100) SpO2:  [99 %-100 %] 100 % (11/08 1100) FiO2 (%):  [40 %] 40 % (11/08 0827) Weight:  [159 lb 13.3 oz (72.5 kg)] 159 lb 13.3 oz (72.5 kg) (11/08 0337)  General - Well nourished, well developed, intubated.  Ophthalmologic - Fundi not visualized due to noncooperation.  Cardiovascular - Regular rate and rhythm.  Neuro - intubated, not responsive to commands. Eye closed and pupil 54mm bilaterally and not respond to light. Doll's eyes present but sluggish, corneal and gag present. On pain stimulation, BUEs decerebrate posturing, and trace withdraw in LEs. B/l babinski negative, DTR 1+ to diminished. Sensation, coordination and gait not tested.    ASSESSMENT/PLAN [name] is a 40 y.o. female with history of HTN and seizures presenting to outlying hospital with  hypertensive emergency followed by seizure. She did not present with stroke symptoms.   Stroke: bilateral supra and infratentorial scattered acute infarcts, embolic pattern with unknown source. Hypercoagulable state due to malignancy or sepsis in possible.  CT at outside hospital with R caudate lacunar infarct  MRI - scattered acute infarcts bilateral anterior and posterior circulation.  MRA - negative for emergent large vessel occlusion  Carotid Doppler unremarkable   2D Echo - EF 65-70%. No cardiac source of emboli identified.  LE venous doppler neg for DVT  LDL  100  HgbA1c 4.7  Heparin subq for VTE prophylaxis Diet NPO time specified, on TF  No antithrombotic prior to admission, now on ASA 325mg .  Brainstem edema - ? Atypical PRES due to hypertensive emergency  MRI brain - extensive brainstem edema  Repeat MRI improved edema  BP control - add po metoprolol  BP goal < 160  Seizure   Not related to stroke seen on CT  EEG generalized nonspecific slowing  Continue trileptal 300 bid  Had new decerebrate posturing 02/16/16  Repeat EEG diffuse slowing no seizure  Respiratory failure  Intubated after seizure for airway protection  CCM on board  AKI  Cre worsened and needed HD  Cre 11.56->7.50->5.69->4.05->7.81  Nephrology on board  ?  Sepsis  Febrile Tmax 101.6   1/4 BCx positive, repeat BCx NGTD  ID on board, consider contamination for B Cx  Breast wound could be the source of infection  On augmentin  Left breast cancer  Ulcerating lesion  Biopsy confirmed carcinoma  Would care  Oncology on board, not recommend systemic treatment, and recommend palliative care  Other Active Problems  Mild anemia 7.4->6.7->8.3->9.3->8.0  Leukocytosis 15.3->13.8->14.7->17.6->15.3  Hospital day # 8   This patient is critically ill due to embolic stroke, likely PRES involving brainstem edema, AKI needing HD, sepsis and respiratory failure, seizure as  well as breast cancer and at significant risk of neurological worsening, death form recurrent stroke, hemorrhagic transformation, kidney failure, heart failure. This patient's care requires constant monitoring of vital signs, hemodynamics, respiratory and cardiac monitoring, review of multiple databases, neurological assessment, discussion with family, other specialists and medical decision making of high complexity. I spent 30 minutes of neurocritical care time in the care of this patient.   Rosalin Hawking, MD PhD Stroke Neurology 02/19/2016 11:12 AM

## 2016-02-19 NOTE — Progress Notes (Signed)
Patient ID: Samantha Pittman, female   DOB: 07/04/1975, 40 y.o.   MRN: [id number]  [facility] Progress Note      Subjective:    Neuro feels prognosis poor Remains intubated, continued posturing with stimulation, esp RUE    Objective:   BP 123/85   Pulse (!) 108   Temp 99.5 F (37.5 C) (Axillary)   Resp 15   Wt 72.5 kg (159 lb 13.3 oz)   LMP  (LMP Unknown)   SpO2 100%   BMI 25.80 kg/m   Intake/Output Summary (Last 24 hours) at 02/19/16 0717 Last data filed at 02/19/16 0600  Gross per 24 hour  Intake             1587 ml  Output                0 ml  Net             1587 ml   Weight change: 0.3 kg (10.6 oz)  Physical Exam: Intubated, +pain response R IJ HD cath (11/4) Regular tachycardia Dynamic precordium S1 and S2 2/6 systolic murmur Clear to auscultation bilaterally, no rales Abd: Soft, flat, nontender No edema. Legs immobilized.  Imaging: Mr Brain Wo Contrast  Result Date: 02/17/2016 CLINICAL DATA:  Stroke.  Hypertension.  Breast cancer. EXAM: MRI HEAD WITHOUT CONTRAST TECHNIQUE: Multiplanar, multiecho pulse sequences of the brain and surrounding structures were obtained without intravenous contrast. COMPARISON:  MRI 02/12/2016 FINDINGS: Brain: Restricted diffusion ni the cerebellar hemispheres left greater than right is unchanged compatible with acute infarction. Multiple areas of restricted diffusion are present in the basal ganglia right greater than left similar to the prior study compatible with acute infarct. Multiple small areas of restricted diffusion in the cerebral white matter bilaterally are mostly the same however there is a new area of restricted diffusion in the right occipital white matter which is relatively small. This is consistent with a new area of acute infarct. Hyperintense signal on diffusion in the posterior spinal cord at the C1 level is seen on both axial and coronal images including repeat thin section imaging and this may be  due to cord infarct. This is unchanged from the prior MRI. Hyperintensity in the pons has improved in the interval . Decrease swelling and mass-effect on the pons. Ventricle size remains normal. Negative for hemorrhage or mass. No shift of the midline structures. Negative for hydrocephalus. Vascular: Normal arterial flow void. Skull and upper cervical spine: Negative Sinuses/Orbits: Mild mucosal edema in the paranasal sinuses. Normal orbit. Other: None IMPRESSION: Multiple areas of acute infarct are similar to the recent MRI. In addition, there may be a small area of infarct in the posterior spinal cord at the C1 level which is similar to the prior study. Cannot completely exclude artifact. There is a new area of patchy restricted diffusion in the right occipital white matter compatible with interval new infarct. Interval improvement in diffuse edema in the pons. Differential diagnosis includes posterior reversible encephalopathy syndrome and osmotic demyelinization. Electronically Signed   By: Franchot Gallo M.D.   On: 02/17/2016 11:56   Dg Chest Port 1 View  Result Date: 02/18/2016 CLINICAL DATA:  Hypertension, acute encephalopathy, acute renal failure. EXAM: PORTABLE CHEST 1 VIEW COMPARISON:  Portable chest x-ray of February 17, 2016 FINDINGS: The lungs are well-expanded. Subtle increased density in the infrahilar region on the right persists. There is no pneumothorax or pleural effusion. The heart and pulmonary vascularity are normal. The mediastinum is normal in  width. The endotracheal tube tip lies 3.3 cm above the carina. The esophagogastric tube tip projects below the inferior margin of the film and has been advanced since the previous study. The dialysis catheter tip projects over the junction of the middle and distal thirds of the SVC. IMPRESSION: Stable appearance of the chest. Interval advancement of the nasogastric tube such that the tip and proximal port lie below the GE junction. Electronically  Signed   By: David  Martinique M.D.   On: 02/18/2016 09:12     Recent Labs Lab 02/13/16 0215 02/13/16 1711 02/14/16 0238 02/15/16 0229 02/16/16 0511 02/17/16 0248 02/18/16 0228 02/19/16 0427  NA 138 140 139 141 139 138 137 137  K 5.2* 5.3* 5.3* 5.7* 4.0 3.8 3.9 4.0  CL 105 110 107 105 97* 97* 95* 97*  CO2 18* 15* 16* 16* 26 26 26  21*  GLUCOSE 131* 112* 108* 109* 133* 111* 114* 104*  BUN 85* 103* 110* 146* 102* 80* 58* 135*  CREATININE 8.60* 9.54* 9.56* 11.56* 7.50* 5.69* 4.05* 7.81*  CALCIUM 8.5* 8.2* 8.8* 8.7* 8.0* 8.4* 8.9 8.9  PHOS 8.6*  --  10.6* 13.2* 8.4* 6.3* 5.6* 10.4*     Recent Labs Lab 02/16/16 0511 02/16/16 1607 02/17/16 0248 02/18/16 0228 02/19/16 0427  WBC 13.8*  --  14.7* 17.6* 15.3*  NEUTROABS 12.3*  --  12.1* 15.1* 12.2*  HGB 6.7* 8.3* 8.3* 9.3* 8.0*  HCT 20.2* 24.8* 25.7* 28.2* 24.3*  MCV 86.7  --  88.0 89.0 88.4  PLT 357  --  387 476* 443*    Medications:    . sodium chloride   Intravenous Once  . amoxicillin-clavulanate  500 mg of amoxicillin Oral Q24H  . aspirin EC  325 mg Oral Daily  . chlorhexidine  15 mL Mouth Rinse BID  . famotidine (PEPCID) IV  20 mg Intravenous Q24H  . feeding supplement (NEPRO CARB STEADY)  1,000 mL Per Tube Q24H  . feeding supplement (PRO-STAT SUGAR FREE 64)  60 mL Per Tube TID  . mouth rinse  15 mL Mouth Rinse 10 times per day  . metoprolol tartrate  25 mg Oral BID  . OXcarbazepine  900 mg Per Tube BID   . sodium chloride 10 mL/hr at 02/17/16 2350   Background:  40 y.o. female with CVA/PRES, AKI d/t ischemic ATN requiring dialysis (initiated 02/16/16). Baseline creatinine not known - 2.64 on admission to outside hospital.    Assessment/ Plan:    1.  Acute renal failure:  Hemodynamically mediated with ischemic ATN. No urine output. S/p HD X 2 (11/5, 11/6). No hard indications for HD today (K and acid base OK) other than azotemia - will wait on decision re moving to palliative care before intervening with another  dialysis.   2. Metabolic acidosis -corrected.  3. Malignant hypertension: metoprolol + prn hydralazine. 4. Anemia/thrombocytopenia: s/p 1 unit with HD 11/5 for Hb 6.7. Hb up to 9.3. 8.3 Iron deficiency with tsat of 5. S/p Feraheme.  5. 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 No plans for systemic treatment per oncology 6. Seizure disorder/altered mentation:  MRI suspicious for PRES. CT increased edema at sites of multiple acute infarcts. Repeat MRI 11/6 new areas of infarct, Remains unresponsive. Decerebrate posturing. EEG no sz activity. Neuro following. Poor prognosis. 7. Sepsis: 1 out of 4 blood cultures positive for strep. Source suspected to be from her fungating breast mass. Zosyn and vancomycin-->augmentin. ID  has signed off.  [name], MD [facility] [phone] Pager 02/19/2016, 7:17 AM

## 2016-02-19 NOTE — Progress Notes (Signed)
PULMONARY / CRITICAL CARE MEDICINE   Name: Samantha Pittman MRN: [id number] DOB: 1975-06-11    ADMISSION DATE:  01/21/2016 CONSULTATION DATE:  01/17/2016  REFERRING MD:  Dr, Langley Gauss / [facility]   CHIEF COMPLAINT:  Weakness / Dizziness   BRIEF:  40 y/o F with PMH of tubal ligation, HTN,seizures who presented to [facility] on 10/30 with a 6 day history of weakness, dizziness and difficulty seeing out of her left eye.    Initial evaluation found the patient to be significantly hypertensive with presenting pressure of 224/160 (MAP 81).  Initial labs Na 135, K 3.6, Cl 93, AG 24, BUN 36, sr cr 2.94, glucose 184, mag 2.5, AST 31/ALT 14, alk phos 136, TSH 0.37, WBC 11.3, Hgb 11, and platelets 51.  UA was negative.  She was treated with captopril, ativan, amlodipine and labetalol to reduce blood pressure.  The patient was admitted to [facility] for further evaluation.  She later had a seizure.  Per report, she was felt to be post ictal while on the medical floor.  Am of 10/31, she remained altered and pupils were unequal.  Follow up CT of the head was obtained which demonstrated an abnormal new hypodensity in the head of the right caudate nucleus extending into the right lentiform nucleus, acute or early subacute infarct in this vicinity.  The patient was intubated and transferred to [facility] for further evaluation.        LINES/TUBES: OETT 10/31 >>  OGT 10/31 >> FOLEY 10/31 >> PIV x2  SIGNIFICANT EVENTS: 10/30 - Admit to [facility] with weakness, dizziness.  Hypertensive emergency.  Seizure > AMS,  10/31 - AMS continued, CT head worrisome for CVA, transferred to [facility]. EEG 10/31:  Mild generalized nonspecific continuous slowing of cerebral activity which can be seen with sedating medications as well as metabolic and toxic encephalopathies. No evidence of epileptiform activity was recorded.  11/1: MRI findings perhaps an usual appearance of Severe Posterior Reversible  Encephalopathy Syndrome.  11/2: Sepsis protocol initiated for fevers, elevated WBC, and elevated procalcitonin. ECHO with EF 65-70% and mildly increased pulmonary artery systolic pressure. Biopsy left breast mass 02/14/16 - Worsening of Cr and Oliguria. Lasix given. Still very obtunded. Per RN, only grimaces with oral care otherwise not very responsive. Start HD 02/15/16 - Making urine. Renal s/p HD . Remains unresponsive - doing PSV. 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 CT head with increased edema at sites of multiple acute infarcts. HD completed. S/p 1uPRBC.  02/17/16: Repeat MRI ordered due to decerebrate posturing which showed new infarct and improvement in diffuse edema in the pons. EEG with mild to moderate diffuse slowing of the background.  02/18/16: Oncology states patient not a candidate for systemic chemotherapy.    SUBJECTIVE/OVERNIGHT/INTERVAL HX 02/19/16: Continued to be febrile throughout day yesterday. Tachycardia mildly improved. Plan for family meeting today. Still comatose without sedation. Grimaces with suction per RN. With continued posturing.   VITAL SIGNS: BP 127/75   Pulse (!) 108   Temp 99.5 F (37.5 C) (Axillary)   Resp 15   Wt 159 lb 13.3 oz (72.5 kg)   LMP  (LMP Unknown)   SpO2 100%   BMI 25.80 kg/m   HEMODYNAMICS:    VENTILATOR SETTINGS: Vent Mode: PCV FiO2 (%):  [40 %] 40 % Set Rate:  [12 bmp-15 bmp] 15 bmp Vt  Set:  [400 mL] 400 mL PEEP:  [5 cmH20] 5 cmH20 Plateau Pressure:  [9 cmH20-16 cmH20] 12 cmH20  INTAKE / OUTPUT: I/O last 3 completed shifts: In: 2397 [I.V.:860; NG/GT:1370; IV Piggyback:167] Out: 2119 [Urine:45; Other:1500]  PHYSICAL EXAMINATION: General:  Critically ill looking on vent Integument:  Warm & dry. No rash on exposed skin. No bruising. HEENT:  Endotracheal tube  in place.  Cardiovascular:  Regular rate. No edema. No appreciable JVD.  Pulmonary:  Clear bilaterally to auscultation. Symmetric chest wall rise on ventilator. Breathing less labored than 11/7.  Abdomen: Soft. Normal bowel sounds. Nondistended.  Musculoskeletal:  No joint deformity or effusion appreciated. Neurological:  No withdrawal to pain in extremities. No spontaneous movements. No response to voice. Posturing. RASS -5.  LABS:  PULMONARY  Recent Labs Lab 02/13/16 1955  PHART 7.323*  PCO2ART 32.1  PO2ART 83.0  HCO3 16.7*  TCO2 18  O2SAT 96.0    CBC  Recent Labs Lab 02/17/16 0248 02/18/16 0228 02/19/16 0427  HGB 8.3* 9.3* 8.0*  HCT 25.7* 28.2* 24.3*  WBC 14.7* 17.6* 15.3*  PLT 387 476* 443*    COAGULATION  Recent Labs Lab 02/15/16 0229 02/16/16 0511 02/17/16 0248 02/18/16 0228 02/19/16 0427  INR 1.54 1.35 1.15 1.11 1.17    CARDIAC    Recent Labs Lab 02/12/16 1034 02/12/16 1701 02/12/16 2215  TROPONINI 1.97* 1.35* 2.23*   No results for input(s): PROBNP in the last 168 hours.   CHEMISTRY  Recent Labs Lab 02/15/16 0229  02/15/16 1430 02/16/16 0511 02/16/16 1600 02/17/16 0248 02/18/16 0228 02/19/16 0427  NA 141  --   --  139  --  138 137 137  K 5.7*  --   --  4.0  --  3.8 3.9 4.0  CL 105  --   --  97*  --  97* 95* 97*  CO2 16*  --   --  26  --  26 26 21*  GLUCOSE 109*  --   --  133*  --  111* 114* 104*  BUN 146*  --   --  102*  --  80* 58* 135*  CREATININE 11.56*  --   --  7.50*  --  5.69* 4.05* 7.81*  CALCIUM 8.7*  --   --  8.0*  --  8.4* 8.9 8.9  MG 2.9*  < > 3.1* 2.8* 2.3  --  2.4 2.8*  PHOS 13.2*  --   --  8.4*  --  6.3* 5.6* 10.4*  < > = values in this interval not displayed. CrCl cannot be calculated (Unknown ideal weight.).   LIVER  Recent Labs Lab 02/13/16 1711  02/15/16 0229 02/16/16 0511 02/17/16 0248 02/18/16 0228 02/19/16 0427  AST 210*  --  33  --   --   --   --   ALT 271*  --  178*  --   --   --   --    ALKPHOS 115  --  114  --   --   --   --   BILITOT 0.6  --  0.8  --   --   --   --   PROT 5.5*  --  6.8  --   --   --   --   ALBUMIN 2.4*  < > 2.5*  2.5* 2.1* 2.3* 2.5* 2.2*  INR  --   < > 1.54 1.35 1.15 1.11 1.17  < > = values in this interval not displayed.  INFECTIOUS  Recent Labs Lab 02/12/16 1825 02/13/16 0215 02/13/16 1711 02/14/16 0238  LATICACIDVEN  --   --  1.0  --   PROCALCITON 0.73 8.77  --  18.68     ENDOCRINE CBG (last 3)   Recent Labs  02/18/16 1929 02/18/16 2343 02/19/16 0328  GLUCAP 121* 111* 105*         IMAGING x48h  - image(s) personally visualized  -   highlighted in bold Mr Brain Wo Contrast  Result Date: 02/17/2016 CLINICAL DATA:  Stroke.  Hypertension.  Breast cancer. EXAM: MRI HEAD WITHOUT CONTRAST TECHNIQUE: Multiplanar, multiecho pulse sequences of the brain and surrounding structures were obtained without intravenous contrast. COMPARISON:  MRI 02/12/2016 FINDINGS: Brain: Restricted diffusion ni the cerebellar hemispheres left greater than right is unchanged compatible with acute infarction. Multiple areas of restricted diffusion are present in the basal ganglia right greater than left similar to the prior study compatible with acute infarct. Multiple small areas of restricted diffusion in the cerebral white matter bilaterally are mostly the same however there is a new area of restricted diffusion in the right occipital white matter which is relatively small. This is consistent with a new area of acute infarct. Hyperintense signal on diffusion in the posterior spinal cord at the C1 level is seen on both axial and coronal images including repeat thin section imaging and this may be due to cord infarct. This is unchanged from the prior MRI. Hyperintensity in the pons has improved in the interval . Decrease swelling and mass-effect on the pons. Ventricle size remains normal. Negative for hemorrhage or mass. No shift of the midline structures. Negative  for hydrocephalus. Vascular: Normal arterial flow void. Skull and upper cervical spine: Negative Sinuses/Orbits: Mild mucosal edema in the paranasal sinuses. Normal orbit. Other: None IMPRESSION: Multiple areas of acute infarct are similar to the recent MRI. In addition, there may be a small area of infarct in the posterior spinal cord at the C1 level which is similar to the prior study. Cannot completely exclude artifact. There is a new area of patchy restricted diffusion in the right occipital white matter compatible with interval new infarct. Interval improvement in diffuse edema in the pons. Differential diagnosis includes posterior reversible encephalopathy syndrome and osmotic demyelinization. Electronically Signed   By: Franchot Gallo M.D.   On: 02/17/2016 11:56   Dg Chest Port 1 View  Result Date: 02/18/2016 CLINICAL DATA:  Hypertension, acute encephalopathy, acute renal failure. EXAM: PORTABLE CHEST 1 VIEW COMPARISON:  Portable chest x-ray of February 17, 2016 FINDINGS: The lungs are well-expanded. Subtle increased density in the infrahilar region on the right persists. There is no pneumothorax or pleural effusion. The heart and pulmonary vascularity are normal. The mediastinum is normal in width. The endotracheal tube tip lies 3.3 cm above the carina. The esophagogastric tube tip projects below the inferior margin of the film and has been advanced since the previous study. The dialysis catheter tip projects over the junction of the middle and distal thirds of the SVC. IMPRESSION: Stable appearance of the chest. Interval advancement of the nasogastric tube such that the tip and proximal port lie below the GE junction. Electronically Signed   By: David  Martinique M.D.   On: 02/18/2016 09:12       ASSESSMENT / PLAN:  NEUROLOGIC A:   Acute Encephalopathy - Unclear etiology. CVA vs PRES vs TTP. 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 MRI with extensive edema without hemorrhage. Superimposed  scattered acute  infarcts.  Seizure - Occurred w/ correction of hypertensive urgency/emergency. H/O Seizure Disorder   - still unresponsive/comatose . Possible decerebrate posturing. CT head with increased edema at sites of multiple acute infarcts. MRI with new infarct. EEG with mild to moderate slowing of background without epileptiform activity.   P:   RASS goal: 0  Neurology Consulted & following Seizure Precautions AEDs per Neurology:  Trileptal avopid benzo and opioids Oncology and Neurology have recommended palliative care consult    CARDIOVASCULAR A:  Hypertensive Emergency - Initially.   Elevated Troponin I - Likely subendocardial ischemia. H/O HTN - No medications as an outpatient.  P:  Continuous Telemetry Monitoring Vitals per Unit Protocol Goal SBP >95 Hydralazine prn  Metoprolol 25 mg BID    RENAL A:   Acute Renal Failure - Most likely ischemic ATN. /multifactorial - sepsis, ACe inhibitor, high bp   -now requiring HD and anuric   P:   HD per renal, holding off while GOC established no acute indications for dialysis     HEMATOLOGIC/ONCOLOGIC A:   Anemia - No signs of active bleeding. Prob due to sepsis. S/p 1uPRBC.  Thrombocytopenia, Resolved    - Schistocytes on smear. 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   P:   Full Vent Support Intermittent ABG & Portable CXR Weaning FiO2 for Sat >92% Albuterol neb prn  GASTROINTESTINAL A:   No acute issues.  P:   NPO Pepcid IV q24hr  Tube feedings   INFECTIOUS  MICROBIOLOGY: MRSA PCR 10/31:  Negative Blood Cx 11/1: Strep  viridans  Breast Wound Culture: Normal skin flora  Hep B negative    A:   Strep viridans bacteremia  - from admit 02/12/2016. ID suspect contaminant Fungating left breast mass   -11/7: Still with fevers; Trach aspirate culture with rare GPC in pairs re-incubated for better growth   P:   Augmentin (Day 6/7)  Monitor re-culture results    ENDOCRINE A:   No acute issues.   P:   Monitor glucose with daily labs.   FAMILY  - Updates: No family at bedside   Phill Myron, [place]. 02/19/2016, 7:52 AM PGY-2, [place]  Attending:  I have seen and examined the patient with nurse practitioner/resident and agree with the note above.  We formulated the plan together and I elicited the following history.    No purposeful movements Posturing overnight Changed to Pressure control for vent synchrony  On exam No response to pain for me Rhonchi bilaterally, vent supported breaths Tachycardic, regular rhythm   Breast cancer > unknown stage, oncology says she is not a candidate for chemotherapy Acute encaphalopathy due to multiple strokes and ill defined swelling in her midbrain> at this point no chance for meaningful recovery, no treatment options available Acute respiratory failure> continue full vent support ESRD> HD prn at this point  All involved subspecialties feel having goals of care/palliative care conversation is appropriate.  Plan for that today  My cc time 31 minutes  [name], MD [place] [facility] Pager: [phone] Cell: [phone] After 3pm or if no response, call [phone]

## 2016-02-20 ENCOUNTER — Inpatient Hospital Stay (HOSPITAL_COMMUNITY): Payer: Medicaid Other

## 2016-02-20 DIAGNOSIS — R569 Unspecified convulsions: Secondary | ICD-10-CM

## 2016-02-20 LAB — POCT I-STAT 3, ART BLOOD GAS (G3+)
ACID-BASE EXCESS: 4 mmol/L — AB (ref 0.0–2.0)
BICARBONATE: 28.6 mmol/L — AB (ref 20.0–28.0)
O2 SAT: 100 %
PO2 ART: 377 mmHg — AB (ref 83.0–108.0)
TCO2: 30 mmol/L (ref 0–100)
pCO2 arterial: 43.8 mmHg (ref 32.0–48.0)
pH, Arterial: 7.423 (ref 7.350–7.450)

## 2016-02-20 LAB — GLUCOSE, CAPILLARY
GLUCOSE-CAPILLARY: 111 mg/dL — AB (ref 65–99)
GLUCOSE-CAPILLARY: 115 mg/dL — AB (ref 65–99)
GLUCOSE-CAPILLARY: 120 mg/dL — AB (ref 65–99)
Glucose-Capillary: 102 mg/dL — ABNORMAL HIGH (ref 65–99)
Glucose-Capillary: 127 mg/dL — ABNORMAL HIGH (ref 65–99)
Glucose-Capillary: 109 mg/dL — ABNORMAL HIGH (ref 65–99)

## 2016-02-20 LAB — CBC WITH DIFFERENTIAL/PLATELET
BASOS ABS: 0 10*3/uL (ref 0.0–0.1)
Basophils Relative: 0 %
EOS PCT: 0 %
Eosinophils Absolute: 0 10*3/uL (ref 0.0–0.7)
HEMATOCRIT: 21.8 % — AB (ref 36.0–46.0)
Hemoglobin: 7.2 g/dL — ABNORMAL LOW (ref 12.0–15.0)
LYMPHS ABS: 1 10*3/uL (ref 0.7–4.0)
Lymphocytes Relative: 7 %
MCH: 29 pg (ref 26.0–34.0)
MCHC: 33 g/dL (ref 30.0–36.0)
MCV: 87.9 fL (ref 78.0–100.0)
MONOS PCT: 5 %
Monocytes Absolute: 0.7 10*3/uL (ref 0.1–1.0)
NEUTROS PCT: 88 %
Neutro Abs: 13.2 10*3/uL — ABNORMAL HIGH (ref 1.7–7.7)
PLATELETS: 419 10*3/uL — AB (ref 150–400)
RBC: 2.48 MIL/uL — AB (ref 3.87–5.11)
RDW: 15.5 % (ref 11.5–15.5)
WBC: 14.9 10*3/uL — AB (ref 4.0–10.5)

## 2016-02-20 LAB — RENAL FUNCTION PANEL
ALBUMIN: 2.1 g/dL — AB (ref 3.5–5.0)
ANION GAP: 21 — AB (ref 5–15)
BUN: 190 mg/dL — AB (ref 6–20)
CO2: 19 mmol/L — AB (ref 22–32)
Calcium: 8.8 mg/dL — ABNORMAL LOW (ref 8.9–10.3)
Chloride: 96 mmol/L — ABNORMAL LOW (ref 101–111)
Creatinine, Ser: 10.22 mg/dL — ABNORMAL HIGH (ref 0.44–1.00)
GFR calc Af Amer: 5 mL/min — ABNORMAL LOW (ref >60)
GFR calc non Af Amer: 4 mL/min — ABNORMAL LOW (ref >60)
GLUCOSE: 122 mg/dL — AB (ref 65–99)
POTASSIUM: 4.3 mmol/L (ref 3.5–5.1)
Phosphorus: 12.3 mg/dL — ABNORMAL HIGH (ref 2.5–4.6)
SODIUM: 136 mmol/L (ref 135–145)

## 2016-02-20 LAB — FIBRINOGEN: FIBRINOGEN: 784 mg/dL — AB (ref 210–475)

## 2016-02-20 LAB — PROTIME-INR
INR: 1.08
Prothrombin Time: 14 seconds (ref 11.4–15.2)

## 2016-02-20 LAB — MAGNESIUM: Magnesium: 3.2 mg/dL — ABNORMAL HIGH (ref 1.7–2.4)

## 2016-02-20 LAB — APTT: APTT: 30 s (ref 24–36)

## 2016-02-20 LAB — PREPARE RBC (CROSSMATCH)

## 2016-02-20 MED ORDER — FENTANYL CITRATE (PF) 100 MCG/2ML IJ SOLN
25.0000 ug | INTRAMUSCULAR | Status: DC | PRN
  Administered 2016-02-20 – 2016-02-22 (×3): 50 ug via INTRAVENOUS
  Filled 2016-02-20 (×3): qty 2

## 2016-02-20 MED ORDER — SODIUM CHLORIDE 0.9 % IV SOLN: Freq: Once | INTRAVENOUS | Status: DC

## 2016-02-20 NOTE — Progress Notes (Signed)
STROKE TEAM PROGRESS NOTE   SUBJECTIVE (INTERVAL HISTORY) RN and dialysis RN are at bedside. Pt no significant change overnight, still intubated and lack of response to voice, trace withdraw at all extremities on pain stimulation. Family would like continue current management till Saturday before considering palliative care.     OBJECTIVE Temp:  [97.8 F (36.6 C)-101 F (38.3 C)] 101 F (38.3 C) (11/09 1523) Pulse Rate:  [95-115] 101 (11/09 1614) Cardiac Rhythm: Sinus tachycardia (11/09 1600) Resp:  [12-23] 18 (11/09 1614) BP: (87-142)/(57-99) 92/77 (11/09 1614) SpO2:  [94 %-100 %] 100 % (11/09 1614) FiO2 (%):  [40 %] 40 % (11/09 1614) Weight:  [159 lb 6.3 oz (72.3 kg)-161 lb 2.5 oz (73.1 kg)] 159 lb 6.3 oz (72.3 kg) (11/09 1257)  CBC:   Recent Labs Lab 02/19/16 0427 02/20/16 0233  WBC 15.3* 14.9*  NEUTROABS 12.2* 13.2*  HGB 8.0* 7.2*  HCT 24.3* 21.8*  MCV 88.4 87.9  PLT 443* 419*    Basic Metabolic Panel:   Recent Labs Lab 02/19/16 0427 02/20/16 0233  NA 137 136  K 4.0 4.3  CL 97* 96*  CO2 21* 19*  GLUCOSE 104* 122*  BUN 135* 190*  CREATININE 7.81* 10.22*  CALCIUM 8.9 8.8*  MG 2.8* 3.2*  PHOS 10.4* 12.3*    Lipid Panel:     Component Value Date/Time   CHOL 183 02/13/2016 0215   TRIG 83 02/13/2016 0215   HDL 66 02/13/2016 0215   CHOLHDL 2.8 02/13/2016 0215   VLDL 17 02/13/2016 0215   LDLCALC 100 (H) 02/13/2016 0215   HgbA1c:  Lab Results  Component Value Date   HGBA1C 4.7 (L) 02/13/2016   Urine Drug Screen:     Component Value Date/Time   LABOPIA NONE DETECTED 02/12/2016 1319   COCAINSCRNUR NONE DETECTED 02/12/2016 1319   LABBENZ NONE DETECTED 02/12/2016 1319   AMPHETMU NONE DETECTED 02/12/2016 1319   THCU NONE DETECTED 02/12/2016 1319   LABBARB NONE DETECTED 02/12/2016 1319    IMAGING I have personally reviewed the radiological images below and agree with the radiology interpretations.  Ct Head Wo Contrast 02/16/2016 IMPRESSION:  Increased edema at sites of multiple acute infarcts, as described above. Mild mass effect on right frontal horn, without midline shift or hydrocephalus. No evidence of intracranial hemorrhage.    Dg Chest Port 1 View 02/17/2016 IMPRESSION: 1. Lines and tubes in stable position. NG tube tip is just below left hemidiaphragm. Advancement of approximately 10 cm should be considered. 2. Stable cardiomegaly. 3.  Mild right base subsegmental atelectasis. These results will be called to the ordering clinician or representative by the Radiologist Assistant, and communication documented in the PACS or zVision Dashboard.   MRI/MRA  02/12/2016 1. Extensive edema affecting the bilateral pons and medulla with associated brainstem swelling. No associated hemorrhage. 2. 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 No associated hemorrhage or mass effect. 3. Intracranial MRA is negative for emergent large vessel occlusion. There is at most mild irregularity of the bilateral PCAs and the right ACA.   EEG  This EEG is abnormal with mild generalized nonspecific continuous slowing of cerebral activity which can be seen with sedating medications as well as metabolic and toxic encephalopathies. No evidence of epileptiform activity was recorded.  CUS Could not obtain images from right carotid due to poor patient position with head turned completely to the right. Tried to reposition patient  multiple times but she kept head firmly to the right. Left :No significant (1-39%) ICA stenosis. Antegrade vertebral flow.  Echocardiogram 02/13/2016 Left ventricle:  The cavity size was normal. There was moderate concentric hypertrophy.  Systolic function was vigorous. The estimated ejection fraction was in the range of 65% to 70%.  Wall motion was normal; there were no regional wall motion  Mr Brain  Wo Contrast 02/17/2016 IMPRESSION: Multiple areas of acute infarct are similar to the recent MRI. In addition, there may be a small area of infarct in the posterior spinal cord at the C1 level which is similar to the prior study. Cannot completely exclude artifact. There is a new area of patchy restricted diffusion in the right occipital white matter compatible with interval new infarct. Interval improvement in diffuse edema in the pons. Differential diagnosis includes posterior reversible encephalopathy syndrome and osmotic demyelinization.   EEG  diffuse cerebral dysfunction that is non-specific in etiology and can be seen with hypoxic/ischemic injury, toxic/metabolic encephalopathies, or medication effect.  The absence of epileptiform discharges does not rule out a clinical diagnosis of epilepsy.  Clinical correlation is advised.  LE venous doppler There is no evidence of deep or superficial vein thrombosis involving the right and left lower extremities. All visualized vessels appear patent and compressible. There is no evidence of Baker's cysts bilaterally.   PHYSICAL EXAM  Temp:  [97.8 F (36.6 C)-101 F (38.3 C)] 101 F (38.3 C) (11/09 1523) Pulse Rate:  [95-115] 101 (11/09 1614) Resp:  [12-23] 18 (11/09 1614) BP: (87-142)/(57-99) 92/77 (11/09 1614) SpO2:  [94 %-100 %] 100 % (11/09 1614) FiO2 (%):  [40 %] 40 % (11/09 1614) Weight:  [159 lb 6.3 oz (72.3 kg)-161 lb 2.5 oz (73.1 kg)] 159 lb 6.3 oz (72.3 kg) (11/09 1257)  General - Well nourished, well developed, intubated.  Ophthalmologic - Fundi not visualized due to noncooperation.  Cardiovascular - Regular rate and rhythm.  Neuro - intubated, not responsive to commands. Eye closed and pupil 21mm bilaterally and not respond to light. Doll's eyes present but sluggish, corneal weaker than yesterday and gag present. On pain stimulation, BUEs and BLEs trace withdraw. B/l babinski negative, DTR 1+ to diminished. Sensation, coordination and  gait not tested.    ASSESSMENT/PLAN Samantha Pittman is a 40 y.o. female with history of HTN and seizures presenting to outlying hospital with hypertensive emergency followed by seizure. She did not present with stroke symptoms.   Stroke: bilateral supra and infratentorial scattered acute infarcts, embolic pattern with unknown source. Hypercoagulable state due to malignancy or sepsis in possible.  CT at outside hospital with R caudate lacunar infarct  MRI - scattered acute infarcts bilateral anterior and posterior circulation.  MRA - negative for emergent large vessel occlusion  Carotid Doppler unremarkable   2D Echo - EF 65-70%. No cardiac source of emboli identified.  LE venous doppler neg for DVT  LDL  100  HgbA1c 4.7  Heparin subq for VTE prophylaxis Diet NPO time specified, on TF  No antithrombotic prior to admission, now on ASA 325mg .  Brainstem edema - ? Atypical PRES due to hypertensive emergency  MRI brain - extensive brainstem edema  Repeat MRI improved edema  BP control - add po metoprolol  BP goal < 160  However, together with clinical picture, the prognosis is very poor.  Seizure   Not related to stroke seen on CT  EEG generalized nonspecific slowing  Continue trileptal 300 bid  Had new decerebrate posturing 02/16/16  Repeat EEG diffuse slowing no seizure  Respiratory failure  Intubated after seizure for airway protection  CCM on board  AKI  Cre worsened and needed HD  Cre 11.56->7.50->5.69->4.05->7.81->10.22  Nephrology on board  ? Sepsis  Febrile Tmax 101 overnight   1/4 BCx positive, repeat BCx NGTD  ID on board, consider contamination for B Cx  Breast wound could be the source of infection  On augmentin  Left breast cancer  Ulcerating lesion  Biopsy confirmed carcinoma  Would care  Oncology on board, not recommend systemic treatment, and recommend palliative care  Other Active Problems  Mild anemia  7.4->6.7->8.3->9.3->8.0->7.2  Leukocytosis 15.3->13.8->14.7->17.6->15.3->14.9  Hospital day # 9   Pt neuro status, neuro imaging and multiorgan failure indicate that pt has very poor prognosis and unlikely to have meaningful neuro recovery. Recommend palliative care and comfort care. Neurology will sign off. Please call with questions. Thanks for the consult.  Samantha Hawking, MD PhD Stroke Neurology 02/20/2016 4:39 PM

## 2016-02-20 NOTE — Progress Notes (Signed)
Went to evaluate the patient in response to her nurse's concern about her breathing. Upon arrival, patient had agonal breathing. Saturation down to 50's. Lungs with rhonchi bilaterally. She is also tachycardic to 110's. She just finished hemodialysis. RT arrived and increased FiO2, did suction and bag ventilation. With this, oxygen saturation rose back to 100%. She was then put back on full ventilation. However, patient continued to have agonal breathing. ABG was obtained and was normal except for PO2 of 377. I ordered fentanyl in 25-50 g when necessary every 1 hour. Stat CXR ordered.

## 2016-02-20 NOTE — Progress Notes (Signed)
[facility]  Called [name] for update, he voiced appreciation for the conversation. Plan family conversation Saturday.  [name], MD [place] [facility] Pager: [phone] Cell: [phone] After 3pm or if no response, call [phone]

## 2016-02-20 NOTE — Care Management Note (Signed)
Case Management Note  Patient Details  Name: Samantha Pittman MRN: [id number] Date of Birth: [birth year]  Subjective/Objective:     Pt admitted s/p CVA               Action/Plan:  Pt found unresponsive .  ATN worse - multifactorial (K 5.3 and bic 16) - will d/w renal about HD - sepsis du eto strep - abx as below -PRES syndrome - remains comatose - Neuro planning MRI in 1 weeks from 02/24/16 - Acute encephalopathy coma - due to above - Breast mass - await bx from 02/13/16 - Will likel need trach given anticipoated long course.  CM will continue to follow for discharge needs    Expected Discharge Date:                  Expected Discharge Plan:     In-House Referral:     Discharge planning Services  CM Consult  Post Acute Care Choice:    Choice offered to:     DME Arranged:    DME Agency:     HH Arranged:    HH Agency:     Status of Service:  In process, will continue to follow  If discussed at Long Length of Stay Meetings, dates discussed:    Additional Comments: 02/20/2016  Discussed in LOS 02/20/16:  Pt remains appropriate for continued stay.  Pt remains on vent - per attending note: Acute respiratory failure > continue full vent support ESRD> HD today per renal, appreciate their support Breast cancer> not a treatment candidate Acute encephalopathy in setting of brainstem injury and strokes> no chance of meaningful recovery.  Tentative withdrawal of care this Sat 02/22/16   02/17/16 Pt remains on ventilator - only responds to pain.  Recent MRI showed increased edema and multiple acute infarcts.  Pt on IHD.  Maryclare Labrador, RN 02/20/2016, 10:03 AM

## 2016-02-20 NOTE — Progress Notes (Signed)
Patient ID: Samantha Pittman, female   DOB: [birth year], 40 y.o.   MRN: [id number]  [facility] Progress Note      Subjective:    No change in neuro Note from family meeting reviewed with DNR + continue current level of care for a few more days RN says son came back and 11PM last night and based on their conversation did not seem to have any grasp of how sick she is or the prognosis (says they were talking about surgery and chemo for her breast CA and such)   Objective:   BP 100/78   Pulse (!) 107   Temp 100 F (37.8 C) (Oral)   Resp 12   Wt 72.7 kg (160 lb 4.4 oz)   LMP  (LMP Unknown)   SpO2 100%   BMI 25.87 kg/m   Intake/Output Summary (Last 24 hours) at 02/20/16 0652 Last data filed at 02/20/16 0600  Gross per 24 hour  Intake             1180 ml  Output                0 ml  Net             1180 ml   Weight change: 0.2 kg (7.1 oz)  Physical Exam: Intubated, +pain response R IJ HD cath (11/4) Regular tachycardia Dynamic precordium S1 and S2 2/6 systolic murmur Coarse BS Abd: Soft, flat, nontender No edema. Legs immobilized.  Imaging: Dg Chest Port 1 View  Result Date: 02/19/2016 CLINICAL DATA:  Acute respiratory failure EXAM: PORTABLE CHEST 1 VIEW COMPARISON:  Yesterday FINDINGS: Tubular devices are stable. Lungs remain clear. Thorax remains rotated to the right. No pneumothorax. IMPRESSION: Stable support apparatus.  Lungs remain clear.  No pneumothorax. Electronically Signed   By: Marybelle Killings M.D.   On: 02/19/2016 08:53     Recent Labs Lab 02/14/16 0238 02/15/16 0229 02/16/16 0511 02/17/16 0248 02/18/16 0228 02/19/16 0427 02/20/16 0233  NA 139 141 139 138 137 137 136  K 5.3* 5.7* 4.0 3.8 3.9 4.0 4.3  CL 107 105 97* 97* 95* 97* 96*  CO2 16* 16* 26 26 26  21* 19*  GLUCOSE 108* 109* 133* 111* 114* 104* 122*  BUN 110* 146* 102* 80* 58* 135* 190*  CREATININE 9.56* 11.56* 7.50* 5.69* 4.05* 7.81* 10.22*  CALCIUM 8.8* 8.7* 8.0* 8.4* 8.9 8.9  8.8*  PHOS 10.6* 13.2* 8.4* 6.3* 5.6* 10.4* 12.3*     Recent Labs Lab 02/17/16 0248 02/18/16 0228 02/19/16 0427 02/20/16 0233  WBC 14.7* 17.6* 15.3* 14.9*  NEUTROABS 12.1* 15.1* 12.2* PENDING  HGB 8.3* 9.3* 8.0* 7.2*  HCT 25.7* 28.2* 24.3* 21.8*  MCV 88.0 89.0 88.4 87.9  PLT 387 476* 443* 419*    Medications:    . sodium chloride   Intravenous Once  . amoxicillin-clavulanate  500 mg of amoxicillin Oral Q24H  . aspirin EC  325 mg Oral Daily  . chlorhexidine  15 mL Mouth Rinse BID  . famotidine (PEPCID) IV  20 mg Intravenous Q24H  . feeding supplement (NEPRO CARB STEADY)  1,000 mL Per Tube Q24H  . feeding supplement (PRO-STAT SUGAR FREE 64)  60 mL Per Tube TID  . mouth rinse  15 mL Mouth Rinse 10 times per day  . metoprolol tartrate  25 mg Oral BID  . OXcarbazepine  900 mg Per Tube BID   . sodium chloride 10 mL/hr at 02/19/16 2000   Background:  40 y.o.  female with CVA/PRES, AKI d/t ischemic ATN requiring dialysis (initiated 02/16/16). Baseline creatinine not known - 2.64 on admission to outside hospital.    Assessment/ Plan:    1.  Acute renal failure:  Hemodynamically mediated with ischemic ATN. No urine output. S/p HD X 2 (11/5, 11/6). Progressive azotemia since last HD - very catabolic (+ protein from TF's)->BUN 190's, creatinine around 10. Since plan is to continue current level of support will plan for dialysis today.   There is no evidence for recovery at this time. 2. Malignant hypertension: metoprolol + prn hydralazine. 3. Anemia/thrombocytopenia: s/p 1 unit with HD 11/5 for Hb 6.7. Hb back down to 7.2. Will give a unit with HD of OK with primary team. Iron deficiency with tsat of 5. S/p Feraheme.  4. 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 No plans for systemic treatment per oncology 5. Seizure disorder/altered mentation:  MRI suspicious for PRES. CT increased edema at sites of  multiple acute infarcts. Repeat MRI 11/6 new areas of infarct, Remains unresponsive. Decerebrate posturing. EEG no sz activity. Neuro following. Poor prognosis. 6. Sepsis: 1 out of 4 blood cultures positive for strep. Source suspected to be from her fungating breast mass. Zosyn and vancomycin-->augmentin. ID has signed off. 7. Disposition - DNR now, but continuing current level of care for a few more days per family meeting yesterday. Based on RN conversation with son late last PM, sounded as if they don't quite understand the gravity/severity of her situation.   [name], MD [facility] [phone] Pager 02/20/2016, 6:52 AM

## 2016-02-20 NOTE — Progress Notes (Signed)
PULMONARY / CRITICAL CARE MEDICINE   Name: Samantha Pittman MRN: [id number] DOB: [birth year]    ADMISSION DATE:  02/03/2016 CONSULTATION DATE:  02/06/2016  REFERRING MD:  Dr, Langley Gauss / [facility]   CHIEF COMPLAINT:  Weakness / Dizziness   BRIEF:  40 y/o F with PMH of tubal ligation, HTN,seizures who presented to [facility] on 10/30 with a 6 day history of weakness, dizziness and difficulty seeing out of her left eye.    Initial evaluation found the patient to be significantly hypertensive with presenting pressure of 224/160 (MAP 81).  Initial labs Na 135, K 3.6, Cl 93, AG 24, BUN 36, sr cr 2.94, glucose 184, mag 2.5, AST 31/ALT 14, alk phos 136, TSH 0.37, WBC 11.3, Hgb 11, and platelets 51.  UA was negative.  She was treated with captopril, ativan, amlodipine and labetalol to reduce blood pressure.  The patient was admitted to [facility] for further evaluation.  She later had a seizure.  Per report, she was felt to be post ictal while on the medical floor.  Am of 10/31, she remained altered and pupils were unequal.  Follow up CT of the head was obtained which demonstrated an abnormal new hypodensity in the head of the right caudate nucleus extending into the right lentiform nucleus, acute or early subacute infarct in this vicinity.  The patient was intubated and transferred to [facility] for further evaluation.        LINES/TUBES: OETT 10/31 >>  OGT 10/31 >> FOLEY 10/31 >> PIV x2 RIJ HD Cath 11/3 >>   SIGNIFICANT EVENTS: 10/30 - Admit to [facility] with weakness, dizziness.  Hypertensive emergency.  Seizure > AMS,  10/31 - AMS continued, CT head worrisome for CVA, transferred to [facility]. EEG 10/31:  Mild generalized nonspecific continuous slowing of cerebral activity which can be seen with sedating medications as well as metabolic and toxic encephalopathies. No evidence of epileptiform activity was recorded.  11/1: MRI findings perhaps an usual appearance of Severe  Posterior Reversible Encephalopathy Syndrome.  11/2: Sepsis protocol initiated for fevers, elevated WBC, and elevated procalcitonin. ECHO with EF 65-70% and mildly increased pulmonary artery systolic pressure. Biopsy left breast mass 02/14/16 - Worsening of Cr and Oliguria. Lasix given. Still very obtunded. Per RN, only grimaces with oral care otherwise not very responsive. Start HD 02/15/16 - Making urine. Renal s/p HD . Remains unresponsive - doing PSV. 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 CT head with increased edema at sites of multiple acute infarcts. HD completed. S/p 1uPRBC.  02/17/16: Repeat MRI ordered due to decerebrate posturing which showed new infarct and improvement in diffuse edema in the pons. EEG with mild to moderate diffuse slowing of the background.  02/18/16: Oncology states patient not a candidate for systemic chemotherapy.  02/19/16: Family meeting >> switch to DNR, continue current care but would not escalate, plan for comfort care starting 11/11 if patient not improved    SUBJECTIVE/OVERNIGHT/INTERVAL HX 02/20/16: Afebrile overnight. RN reports that she was told in sign out that son returned last night and was talking about chemo and surgery to treat his mother's breast cancer.   VITAL SIGNS: BP 100/78   Pulse (!) 107   Temp 100 F (37.8 C) (Oral)   Resp 12   Wt 160 lb 4.4 oz (72.7 kg)   LMP  (LMP Unknown)   SpO2  100%   BMI 25.87 kg/m   HEMODYNAMICS:    VENTILATOR SETTINGS: Vent Mode: PCV FiO2 (%):  [40 %] 40 % Set Rate:  [15 bmp] 15 bmp PEEP:  [5 cmH20] 5 cmH20 Plateau Pressure:  [11 cmH20-19 cmH20] 19 cmH20  INTAKE / OUTPUT: I/O last 3 completed shifts: In: 1620 [I.V.:340; NG/GT:1230; IV Piggyback:50] Out: -   PHYSICAL EXAMINATION: General:  Critically ill looking on vent Integument:   Warm & dry. No rash on exposed skin. No bruising. HEENT:  Endotracheal tube in place.  Cardiovascular:  Regular rate. No edema. No appreciable JVD.  Pulmonary:  Coarse breath sounds.  Symmetric chest wall rise on ventilator. Abdomen: Soft. Normal bowel sounds. Nondistended.  Musculoskeletal:  No joint deformity or effusion appreciated. Neurological:  No withdrawal to pain in extremities. No spontaneous movements. No response to voice. Posturing. RASS -5.  LABS:  PULMONARY  Recent Labs Lab 02/13/16 1955  PHART 7.323*  PCO2ART 32.1  PO2ART 83.0  HCO3 16.7*  TCO2 18  O2SAT 96.0    CBC  Recent Labs Lab 02/18/16 0228 02/19/16 0427 02/20/16 0233  HGB 9.3* 8.0* 7.2*  HCT 28.2* 24.3* 21.8*  WBC 17.6* 15.3* 14.9*  PLT 476* 443* 419*    COAGULATION  Recent Labs Lab 02/16/16 0511 02/17/16 0248 02/18/16 0228 02/19/16 0427 02/20/16 0233  INR 1.35 1.15 1.11 1.17 1.08    CARDIAC   No results for input(s): TROPONINI in the last 168 hours. No results for input(s): PROBNP in the last 168 hours.   CHEMISTRY  Recent Labs Lab 02/16/16 0511 02/16/16 1600 02/17/16 0248 02/18/16 0228 02/19/16 0427 02/20/16 0233  NA 139  --  138 137 137 136  K 4.0  --  3.8 3.9 4.0 4.3  CL 97*  --  97* 95* 97* 96*  CO2 26  --  26 26 21* 19*  GLUCOSE 133*  --  111* 114* 104* 122*  BUN 102*  --  80* 58* 135* 190*  CREATININE 7.50*  --  5.69* 4.05* 7.81* 10.22*  CALCIUM 8.0*  --  8.4* 8.9 8.9 8.8*  MG 2.8* 2.3  --  2.4 2.8* 3.2*  PHOS 8.4*  --  6.3* 5.6* 10.4* 12.3*   CrCl cannot be calculated (Unknown ideal weight.).   LIVER  Recent Labs Lab 02/13/16 1711  02/15/16 0229 02/16/16 0511 02/17/16 0248 02/18/16 0228 02/19/16 0427 02/20/16 0233  AST 210*  --  33  --   --   --   --   --   ALT 271*  --  178*  --   --   --   --   --   ALKPHOS 115  --  114  --   --   --   --   --   BILITOT 0.6  --  0.8  --   --   --   --   --   PROT 5.5*  --  6.8  --   --   --   --   --    ALBUMIN 2.4*  < > 2.5*  2.5* 2.1* 2.3* 2.5* 2.2* 2.1*  INR  --   < > 1.54 1.35 1.15 1.11 1.17 1.08  < > = values in this interval not displayed.   INFECTIOUS  Recent Labs Lab 02/13/16 1711 02/14/16 0238  LATICACIDVEN 1.0  --   PROCALCITON  --  18.68     ENDOCRINE CBG (last 3)   Recent Labs  02/19/16 1950 02/19/16 2335  02/20/16 0404  GLUCAP 103* 103* 102*         IMAGING x48h  - image(s) personally visualized  -   highlighted in bold Dg Chest Port 1 View  Result Date: 02/19/2016 CLINICAL DATA:  Acute respiratory failure EXAM: PORTABLE CHEST 1 VIEW COMPARISON:  Yesterday FINDINGS: Tubular devices are stable. Lungs remain clear. Thorax remains rotated to the right. No pneumothorax. IMPRESSION: Stable support apparatus.  Lungs remain clear.  No pneumothorax. Electronically Signed   By: Marybelle Killings M.D.   On: 02/19/2016 08:53       ASSESSMENT / PLAN:  NEUROLOGIC A:   Acute Encephalopathy - Unclear etiology. CVA vs PRES vs TTP. 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 MRI with extensive edema without hemorrhage. Superimposed scattered acute infarcts.  Seizure - Occurred w/ correction of hypertensive urgency/emergency. H/O Seizure Disorder   - still unresponsive/comatose . Possible decerebrate posturing. CT head with increased edema at sites of multiple acute infarcts. MRI with new infarct. EEG with mild to moderate slowing of background without epileptiform activity.   P:   RASS goal: 0  Neurology Consulted & following Seizure Precautions AEDs per Neurology:  Trileptal avopid benzo and opioids Oncology and Neurology have recommended palliative care consult    CARDIOVASCULAR A:  Hypertensive Emergency - Initially.   Elevated Troponin I - Likely subendocardial ischemia. H/O HTN - No medications as an outpatient.  P:  Continuous Telemetry Monitoring Vitals per Unit Protocol Goal SBP >95 Hydralazine prn  Metoprolol 25 mg BID    RENAL A:    Acute Renal Failure - Most likely ischemic ATN. /multifactorial - sepsis, ACe inhibitor, high bp   -Progressive Azotemia. No UOP.    P:   Plan for HD 11/9     HEMATOLOGIC/ONCOLOGIC A:   Anemia - No signs of active bleeding. Prob due to sepsis. S/p 1uPRBC.  Thrombocytopenia, Resolved    - Schistocytes on smear. Possible TTP entertained at admit  with altered mental status but considered doubtful by heme left breast mass 1bx 02/13/16 - - invasive poordly differerentiated carcinoma grade 2/3   P:  Trending Cell Counts daily w/ CBC Trending Coags daily SCDs Oncology signed off, patient not candidate for systemic therapy  1uPRBC with HD on 11/9     PULMONARY A: Acute Respiratory Failure - Unable to protect airway with altered mental status.   P:   Full Vent Support Intermittent ABG & Portable CXR Weaning FiO2 for Sat >92% Albuterol neb prn  GASTROINTESTINAL A:   No acute issues.  P:   NPO Pepcid IV q24hr  Tube feedings   INFECTIOUS  MICROBIOLOGY: MRSA PCR 10/31:  Negative Blood Cx 11/1: Strep viridans  Breast Wound Culture: Normal skin flora  Hep B negative    A:   Strep viridans bacteremia  - from admit 02/12/2016. ID suspect contaminant Fungating left breast mass     P:   Augmentin (Day 7/7)  Monitor re-culture results    ENDOCRINE A:   No acute issues.   P:   Monitor glucose with daily labs.   FAMILY  - Updates: No family at bedside   Phill Myron, [place]. 02/20/2016, 7:11 AM PGY-2, [facility] [place]  Attending:  I have seen and examined the patient with nurse practitioner/resident and agree with the note above.  We formulated the plan together and I elicited the following history.    Long family conversation, decision for DNR, but apparently son wants to re-consider cancer treatment  On exam Grimace to pain today No localization to pain No response to deep suctioning No eye opening Lungs: rhonchi on exam, vent  supported breaths   Acute respiratory failure > continue full vent support ESRD> HD today per renal, appreciate their support Breast cancer> not a treatment candidate Acute encephalopathy in setting of brainstem injury and strokes> no chance of meaningful recovery  Summary: I need to address goals of care with son [place]is [place] today, may need palliative involvement  Cc time 31 minutes  [name], MD [facility] Pager: [phone] Cell: [phone] After 3pm or if no response, call [phone]

## 2016-02-20 NOTE — Procedures (Signed)
Called by RN due to vent alarming, on arrival to room RR >35, pt had increased WOB, placed pt back on full support at this time, RT will continue to monitor

## 2016-02-21 ENCOUNTER — Inpatient Hospital Stay (HOSPITAL_COMMUNITY): Payer: Medicaid Other

## 2016-02-21 DIAGNOSIS — I6300 Cerebral infarction due to thrombosis of unspecified precerebral artery: Secondary | ICD-10-CM

## 2016-02-21 LAB — CBC WITH DIFFERENTIAL/PLATELET
BASOS ABS: 0.1 10*3/uL (ref 0.0–0.1)
BASOS PCT: 0 %
EOS ABS: 0 10*3/uL (ref 0.0–0.7)
EOS PCT: 0 %
HCT: 27.5 % — ABNORMAL LOW (ref 36.0–46.0)
HEMOGLOBIN: 9 g/dL — AB (ref 12.0–15.0)
LYMPHS ABS: 1.8 10*3/uL (ref 0.7–4.0)
Lymphocytes Relative: 11 %
MCH: 28.7 pg (ref 26.0–34.0)
MCHC: 32.7 g/dL (ref 30.0–36.0)
MCV: 87.6 fL (ref 78.0–100.0)
Monocytes Absolute: 0.9 10*3/uL (ref 0.1–1.0)
Monocytes Relative: 5 %
NEUTROS PCT: 84 %
Neutro Abs: 14.2 10*3/uL — ABNORMAL HIGH (ref 1.7–7.7)
PLATELETS: 385 10*3/uL (ref 150–400)
RBC: 3.14 MIL/uL — AB (ref 3.87–5.11)
RDW: 15.6 % — ABNORMAL HIGH (ref 11.5–15.5)
WBC: 16.9 10*3/uL — AB (ref 4.0–10.5)

## 2016-02-21 LAB — RENAL FUNCTION PANEL
ALBUMIN: 2.3 g/dL — AB (ref 3.5–5.0)
Anion gap: 17 — ABNORMAL HIGH (ref 5–15)
BUN: 119 mg/dL — AB (ref 6–20)
CALCIUM: 8.4 mg/dL — AB (ref 8.9–10.3)
CHLORIDE: 96 mmol/L — AB (ref 101–111)
CO2: 21 mmol/L — ABNORMAL LOW (ref 22–32)
CREATININE: 6.99 mg/dL — AB (ref 0.44–1.00)
GFR, EST AFRICAN AMERICAN: 8 mL/min — AB (ref >60)
GFR, EST NON AFRICAN AMERICAN: 7 mL/min — AB (ref >60)
Glucose, Bld: 102 mg/dL — ABNORMAL HIGH (ref 65–99)
PHOSPHORUS: 10.1 mg/dL — AB (ref 2.5–4.6)
Potassium: 4.1 mmol/L (ref 3.5–5.1)
SODIUM: 134 mmol/L — AB (ref 135–145)

## 2016-02-21 LAB — GLUCOSE, CAPILLARY
GLUCOSE-CAPILLARY: 110 mg/dL — AB (ref 65–99)
GLUCOSE-CAPILLARY: 116 mg/dL — AB (ref 65–99)
Glucose-Capillary: 95 mg/dL (ref 65–99)
Glucose-Capillary: 102 mg/dL — ABNORMAL HIGH (ref 65–99)

## 2016-02-21 LAB — MAGNESIUM: MAGNESIUM: 2.5 mg/dL — AB (ref 1.7–2.4)

## 2016-02-21 LAB — PROTIME-INR
INR: 1.11
Prothrombin Time: 14.4 seconds (ref 11.4–15.2)

## 2016-02-21 LAB — TYPE AND SCREEN
ABO/RH(D): O POS
Antibody Screen: NEGATIVE
Unit division: 0

## 2016-02-21 LAB — FIBRINOGEN: FIBRINOGEN: 794 mg/dL — AB (ref 210–475)

## 2016-02-21 LAB — APTT: aPTT: 33 seconds (ref 24–36)

## 2016-02-21 NOTE — Progress Notes (Signed)
Patient ID: Samantha Pittman, female   DOB: 1976-01-11, 40 y.o.   MRN: [id number]  [facility] Progress Note      Subjective:    No change in neuro HD yesterday Issues with agonal breathing last PM Appears current plan is to regroup on Saturday with family   Objective:   BP (!) 141/127   Pulse 94   Temp 99.5 F (37.5 C) (Axillary)   Resp 15   Wt (!) 158.3 kg (348 lb 15.8 oz)   LMP  (LMP Unknown)   SpO2 100%   BMI 56.33 kg/m   Intake/Output Summary (Last 24 hours) at 02/21/16 0651 Last data filed at 02/21/16 [id number]  Gross per 24 hour  Intake             2535 ml  Output             1025 ml  Net             1510 ml   Weight change: 0.4 kg (14.1 oz)  Physical Exam: Intubated, unresponsive R IJ HD cath (11/4) Regular tachycardia Dynamic precordium S1 and S2 2/6 systolic murmur Coarse BS Abd: Soft, flat, nontender No edema. Legs immobilized.  Imaging: Dg Chest Port 1 View  Result Date: 02/20/2016 CLINICAL DATA:  Hypoxia EXAM: PORTABLE CHEST 1 VIEW COMPARISON:  Study obtained earlier in the day FINDINGS: Endotracheal tube tip is 3.3 cm above the carina. Nasogastric tube tip and side port are in the stomach region. Central catheter tip is in the superior vena cava. No pneumothorax. The consolidation in the right base has resolved with lungs currently clear. Heart size and pulmonary vascularity are normal. No adenopathy. No bone lesions. IMPRESSION: Tube and catheter positions as described without pneumothorax. Lungs now clear. Electronically Signed   By: Lowella Grip III M.D.   On: 02/20/2016 14:34   Dg Chest Port 1 View  Result Date: 02/20/2016 CLINICAL DATA:  Short of breath.  Respiratory failure. EXAM: PORTABLE CHEST 1 VIEW COMPARISON:  02/19/2016 FINDINGS: Tubular device is stable. Increasing atelectasis at the right lung base. Lungs otherwise clear. No pneumothorax. IMPRESSION: Increasing atelectasis at the right lung base. Electronically Signed   By:  Marybelle Killings M.D.   On: 02/20/2016 07:55     Recent Labs Lab 02/15/16 0229 02/16/16 0511 02/17/16 0248 02/18/16 0228 02/19/16 0427 02/20/16 0233 02/21/16 0234  NA 141 139 138 137 137 136 134*  K 5.7* 4.0 3.8 3.9 4.0 4.3 4.1  CL 105 97* 97* 95* 97* 96* 96*  CO2 16* 26 26 26  21* 19* 21*  GLUCOSE 109* 133* 111* 114* 104* 122* 102*  BUN 146* 102* 80* 58* 135* 190* 119*  CREATININE 11.56* 7.50* 5.69* 4.05* 7.81* 10.22* 6.99*  CALCIUM 8.7* 8.0* 8.4* 8.9 8.9 8.8* 8.4*  PHOS 13.2* 8.4* 6.3* 5.6* 10.4* 12.3* 10.1*     Recent Labs Lab 02/18/16 0228 02/19/16 0427 02/20/16 0233 02/21/16 0235  WBC 17.6* 15.3* 14.9* 16.9*  NEUTROABS 15.1* 12.2* 13.2* 14.2*  HGB 9.3* 8.0* 7.2* 9.0*  HCT 28.2* 24.3* 21.8* 27.5*  MCV 89.0 88.4 87.9 87.6  PLT 476* 443* 419* 385    Medications:    . sodium chloride   Intravenous Once  . sodium chloride   Intravenous Once  . aspirin EC  325 mg Oral Daily  . chlorhexidine  15 mL Mouth Rinse BID  . famotidine (PEPCID) IV  20 mg Intravenous Q24H  . feeding supplement (NEPRO CARB STEADY)  1,000 mL  Per Tube Q24H  . feeding supplement (PRO-STAT SUGAR FREE 64)  60 mL Per Tube TID  . mouth rinse  15 mL Mouth Rinse 10 times per day  . metoprolol tartrate  25 mg Oral BID  . OXcarbazepine  900 mg Per Tube BID   . sodium chloride 10 mL/hr at 02/20/16 1936   Background:  40 y.o. female with CVA/PRES, AKI d/t ischemic ATN requiring dialysis (initiated 02/16/16). Baseline creatinine not known - 2.64 on admission to outside hospital.    Assessment/ Plan:    1.  Acute renal failure:  Hemodynamically mediated with ischemic ATN. No UOP at all since 11/6. S/p HD X 2 (11/5, 11/6, 11/9). Since plan is to continue current level of support will plan for dialysis again tomorrow.   There is no evidence for recovery at this time. 2. Malignant hypertension: metoprolol + prn hydralazine. 3. Anemia/thrombocytopenia: s/p 1 unit with HD 11/5 for Hb 6.7. Hb back down to 7.2  yesterday and rec'd another unit with HD. Hb up to 9.  s/p Feraheme for tsat of 5.  4. 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 No plans for systemic treatment per oncology 5. Seizure disorder/altered mentation:  MRI suspicious for PRES. CT increased edema at sites of multiple acute infarcts. Repeat MRI 11/6 new areas of infarct, Remains unresponsive. Decerebrate posturing. EEG no sz activity. Neuro following. Poor prognosis. 6. Sepsis: 1 out of 4 blood cultures positive for strep. Source suspected to be from her fungating breast mass. Zosyn and vancomycin-->augmentin completed. ID has signed off. 7. Disposition - DNR, but continuing current level of care. Regrouping with family on Saturday. HD tomorrow unless hear otherwise from primary team.   [name], MD [place] [phone] Pager 02/21/2016, 6:51 AM

## 2016-02-21 NOTE — Progress Notes (Addendum)
PULMONARY / CRITICAL CARE MEDICINE   Name: Samantha Pittman MRN: [id number] DOB: 1976-01-03    ADMISSION DATE:  01/12/2016 CONSULTATION DATE:  02/02/2016  REFERRING MD:  Dr, Langley Gauss / [facility]   CHIEF COMPLAINT:  Weakness / Dizziness   BRIEF:  40 y/o F with PMH of tubal ligation, HTN,seizures who presented to [facility] on 10/30 with a 6 day history of weakness, dizziness and difficulty seeing out of her left eye.    Initial evaluation found the patient to be significantly hypertensive with presenting pressure of 224/160 (MAP 81).  Initial labs Na 135, K 3.6, Cl 93, AG 24, BUN 36, sr cr 2.94, glucose 184, mag 2.5, AST 31/ALT 14, alk phos 136, TSH 0.37, WBC 11.3, Hgb 11, and platelets 51.  UA was negative.  She was treated with captopril, ativan, amlodipine and labetalol to reduce blood pressure.  The patient was admitted to [facility] for further evaluation.  She later had a seizure.  Per report, she was felt to be post ictal while on the medical floor.  Am of 10/31, she remained altered and pupils were unequal.  Follow up CT of the head was obtained which demonstrated an abnormal new hypodensity in the head of the right caudate nucleus extending into the right lentiform nucleus, acute or early subacute infarct in this vicinity.  The patient was intubated and transferred to [facility] for further evaluation.    LINES/TUBES: OETT 10/31 >>  OGT 10/31 >> FOLEY 10/31 >> PIV x2 RIJ HD Cath 11/3 >>   SIGNIFICANT EVENTS: 10/30 - Admit to [facility] with weakness, dizziness.  Hypertensive emergency.  Seizure > AMS,  10/31 - AMS continued, CT head worrisome for CVA, transferred to [facility]. EEG 10/31:  Mild generalized nonspecific continuous slowing of cerebral activity which can be seen with sedating medications as well as metabolic and toxic encephalopathies. No evidence of epileptiform activity was recorded.  11/1: MRI findings perhaps an usual appearance of Severe Posterior  Reversible Encephalopathy Syndrome.  11/2: Sepsis protocol initiated for fevers, elevated WBC, and elevated procalcitonin. ECHO with EF 65-70% and mildly increased pulmonary artery systolic pressure. Biopsy left breast mass 02/14/16 - Worsening of Cr and Oliguria. Lasix given. Still very obtunded. Per RN, only grimaces with oral care otherwise not very responsive. Start HD 02/15/16 - Making urine. Renal s/p HD . Remains unresponsive - doing PSV. 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 CT head with increased edema at sites of multiple acute infarcts. HD completed. S/p 1uPRBC.  02/17/16: Repeat MRI ordered due to decerebrate posturing which showed new infarct and improvement in diffuse edema in the pons. EEG with mild to moderate diffuse slowing of the background.  02/18/16: Oncology states patient not a candidate for systemic chemotherapy.  02/19/16: Family meeting >> switch to DNR, continue current care but would not escalate, plan for comfort care starting 11/11 if patient not improved  02/20/16: Desaturation to 50s on Vent with agonal breathing. Resolved with deep suction and bag ventilation.    SUBJECTIVE/OVERNIGHT/INTERVAL HX 02/21/16: Episodes of loose stools overnight with fevers.   VITAL SIGNS: BP (!) 141/127   Pulse 94   Temp 99.5 F (37.5 C) (Axillary)   Resp 15   Wt (!) 348 lb 15.8 oz (158.3 kg)   LMP  (LMP Unknown)   SpO2 100%   BMI 56.33 kg/m  HEMODYNAMICS:    VENTILATOR SETTINGS: Vent Mode: PCV FiO2 (%):  [40 %] 40 % Set Rate:  [15 bmp] 15 bmp PEEP:  [5 cmH20] 5 cmH20 Pressure Support:  [5 cmH20] 5 cmH20 Plateau Pressure:  [12 cmH20-19 cmH20] 15 cmH20  INTAKE / OUTPUT: I/O last 3 completed shifts: In: 3125 [I.V.:350; Blood:1075; NG/GT:1650; IV Piggyback:50] Out: 57 [Other:800; Stool:225]  PHYSICAL  EXAMINATION: General:  Critically ill looking on vent Integument:  Warm & dry. No rash on exposed skin. No bruising. HEENT:  Endotracheal tube in place.  Cardiovascular:  Regular rate. No edema. No appreciable JVD.  Pulmonary:  Coarse breath sounds.  Symmetric chest wall rise on ventilator. Abdomen: Soft. Normal bowel sounds. Nondistended.  Musculoskeletal:  No joint deformity or effusion appreciated. Neurological:  Grimaces to pain but does not localize. No spontaneous movements. No response to voice. Posturing. RASS -5.  LABS:  PULMONARY  Recent Labs Lab 02/20/16 1322  PHART 7.423  PCO2ART 43.8  PO2ART 377.0*  HCO3 28.6*  TCO2 30  O2SAT 100.0    CBC  Recent Labs Lab 02/19/16 0427 02/20/16 0233 02/21/16 0235  HGB 8.0* 7.2* 9.0*  HCT 24.3* 21.8* 27.5*  WBC 15.3* 14.9* 16.9*  PLT 443* 419* 385    COAGULATION  Recent Labs Lab 02/17/16 0248 02/18/16 0228 02/19/16 0427 02/20/16 0233 02/21/16 0235  INR 1.15 1.11 1.17 1.08 1.11    CARDIAC   No results for input(s): TROPONINI in the last 168 hours. No results for input(s): PROBNP in the last 168 hours.   CHEMISTRY  Recent Labs Lab 02/16/16 1600 02/17/16 0248 02/18/16 0228 02/19/16 0427 02/20/16 0233 02/21/16 0234 02/21/16 0235  NA  --  138 137 137 136 134*  --   K  --  3.8 3.9 4.0 4.3 4.1  --   CL  --  97* 95* 97* 96* 96*  --   CO2  --  26 26 21* 19* 21*  --   GLUCOSE  --  111* 114* 104* 122* 102*  --   BUN  --  80* 58* 135* 190* 119*  --   CREATININE  --  5.69* 4.05* 7.81* 10.22* 6.99*  --   CALCIUM  --  8.4* 8.9 8.9 8.8* 8.4*  --   MG 2.3  --  2.4 2.8* 3.2*  --  2.5*  PHOS  --  6.3* 5.6* 10.4* 12.3* 10.1*  --    CrCl cannot be calculated (Unknown ideal weight.).   LIVER  Recent Labs Lab 02/15/16 0229  02/17/16 0248 02/18/16 0228 02/19/16 0427 02/20/16 0233 02/21/16 0234 02/21/16 0235  AST 33  --   --   --   --   --   --   --   ALT 178*  --   --   --   --   --   --   --   ALKPHOS  114  --   --   --   --   --   --   --   BILITOT 0.8  --   --   --   --   --   --   --   PROT 6.8  --   --   --   --   --   --   --   ALBUMIN 2.5*  2.5*  < > 2.3* 2.5* 2.2* 2.1* 2.3*  --   INR 1.54  < > 1.15 1.11 1.17 1.08  --  1.11  < > =  values in this interval not displayed.   INFECTIOUS No results for input(s): LATICACIDVEN, PROCALCITON in the last 168 hours.   ENDOCRINE CBG (last 3)   Recent Labs  02/20/16 1939 02/20/16 2333 02/21/16 0415  GLUCAP 109* 111* 95         IMAGING x48h  - image(s) personally visualized  -   highlighted in bold Dg Chest Port 1 View  Result Date: 02/20/2016 CLINICAL DATA:  Hypoxia EXAM: PORTABLE CHEST 1 VIEW COMPARISON:  Study obtained earlier in the day FINDINGS: Endotracheal tube tip is 3.3 cm above the carina. Nasogastric tube tip and side port are in the stomach region. Central catheter tip is in the superior vena cava. No pneumothorax. The consolidation in the right base has resolved with lungs currently clear. Heart size and pulmonary vascularity are normal. No adenopathy. No bone lesions. IMPRESSION: Tube and catheter positions as described without pneumothorax. Lungs now clear. Electronically Signed   By: Lowella Grip III M.D.   On: 02/20/2016 14:34   Dg Chest Port 1 View  Result Date: 02/20/2016 CLINICAL DATA:  Short of breath.  Respiratory failure. EXAM: PORTABLE CHEST 1 VIEW COMPARISON:  02/19/2016 FINDINGS: Tubular device is stable. Increasing atelectasis at the right lung base. Lungs otherwise clear. No pneumothorax. IMPRESSION: Increasing atelectasis at the right lung base. Electronically Signed   By: Marybelle Killings M.D.   On: 02/20/2016 07:55       ASSESSMENT / PLAN:  NEUROLOGIC A:   Acute Encephalopathy - Unclear etiology. CVA vs PRES vs TTP. 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 MRI with extensive edema without hemorrhage. Superimposed scattered acute infarcts.  Seizure - Occurred w/ correction of hypertensive  urgency/emergency. H/O Seizure Disorder   - still unresponsive/comatose . Possible decerebrate posturing. CT head with increased edema at sites of multiple acute infarcts. MRI with new infarct. EEG with mild to moderate slowing of background without epileptiform activity.   P:   RASS goal: 0  Seizure Precautions AEDs per Neurology:  Trileptal avopid benzo and opioids Oncology and Neurology have recommended palliative care consult   CARDIOVASCULAR A:  Hypertensive Emergency - Initially.   Elevated Troponin I - Likely subendocardial ischemia. H/O HTN - No medications as an outpatient.  P:  Continuous Telemetry Monitoring Vitals per Unit Protocol Goal SBP >95 Hydralazine prn  Metoprolol 25 mg BID   RENAL A:   Acute Renal Failure - Most likely ischemic ATN. /multifactorial - sepsis, ACe inhibitor, high bp  P:   Plan for HD 11/11, we need to hold off until pall conversations had in am with Dr Lake Bells  HEMATOLOGIC/ONCOLOGIC A:   Anemia - No signs of active bleeding. Prob due to sepsis. S/p 2uPRBC.  Thrombocytopenia, Resolved    - Schistocytes on smear. 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   P:   Full Vent Support Intermittent ABG & Portable CXR Weaning FiO2 for Sat >92% Albuterol neb prn pcv remains  GASTROINTESTINAL A:   No acute issues.  P:   Pepcid IV q24hr Tube feedings , hold am for possible comfort  INFECTIOUS  MICROBIOLOGY: MRSA PCR 10/31:  Negative Blood Cx 11/1: Strep viridans  Breast Wound Culture: Normal  skin flora  Hep B negative   A:   Strep viridans bacteremia  - from admit 02/12/2016. ID suspect  contaminant Fungating left breast mass     P:   S/p Augmentin     ENDOCRINE A:   No acute issues.   P:   Monitor glucose with daily labs.   FAMILY  - Updates: No family at bedside   Phill Myron, [place]. 02/21/2016, 7:24 AM PGY-2,  Family Medicine   STAFF NOTE: I, Merrie Roof, MD FACP have personally reviewed patient's available data, including medical history, events of note, physical examination and test results as part of my evaluation. I have discussed with resident/NP and other care providers such as pharmacist, RN and RRT. In addition, I personally evaluated patient and elicited key findings of: unresponsive, postures decer, MRI reviewed, lungs clear, concern for trigger - no wean, No abg need, no pcxr needed, pressors would be futile, currently controlled BP with anti HTN, continued HD would futile and medically ineffective therapy, hope for comfort care in am , no labs needed in am , glu controlled, concentrate on pain assessment as able, has fever and loose stool, will add contact isolation but NO utility in cdiff as would not change outcome for the pt, no order cdiff The patient is critically ill with multiple organ systems failure and requires high complexity decision making for assessment and support, frequent evaluation and titration of therapies, application of advanced monitoring technologies and extensive interpretation of multiple databases.   Critical Care Time devoted to patient care services described in this note is 30 Minutes. This time reflects time of care of this signee: Merrie Roof, MD FACP. This critical care time does not reflect procedure time, or teaching time or supervisory time of PA/NP/Med student/Med Resident etc but could involve care discussion time. Rest per NP/medical resident whose note is outlined above and that I agree with   Lavon Paganini. Titus Mould, MD, Jette Pgr: [place] [facility] 02/21/2016  9:33 AM

## 2016-02-21 NOTE — Progress Notes (Signed)
Nutrition Follow-up  INTERVENTION:   - Continue Nepro @ 30 mL/hr (720 mL) and Pro-stat 60 mL TID to provide 1896 kcal, 148 grams protein, and 523 mL free water daily.  NUTRITION DIAGNOSIS:   Inadequate oral intake related to inability to eat as evidenced by NPO status.  Ongoing.  GOAL:   Provide needs based on ASPEN/SCCM guidelines  Met.  MONITOR:   Diet advancement, Vent status, Labs, Weight trends, I & O's, Skin  ASSESSMENT:   40 y/o F with PMH of tubal ligation, HTN,seizures who presented to [facility] on 10/30 with a 6 day history of weakness, dizziness and difficulty seeing out of her left eye. Initial evaluation found the patient to be significantly hypertensive with presenting pressure of 224/160 (MAP 81).  The patient was admitted to [facility] for further evaluation. She later had a seizure. The patient was intubated and transferred to [facility] for further evaluation.   Nepro infusing @ 30 mL/hr through OG tube at time of visit. Free water flushes of 30 mL every 4 hours programmed in pump. Will keep TF as is due to MD note to possibly hold TF in AM for comfort and upcoming comfort care discussion on 02/22/16. Attended interdisciplinary rounds.  Pt recently started on hemodialysis. Suspect most recent weight was entered incorrectly.  Pt is currently intubated on ventilator support. MV: 9.7 L/min Highest temperature in 24 hours: 38.4 degrees Celsius BP (MAP): 124/75 (88)  Medications reviewed and include 20 mg Pepcid daily, 25 mg Lopressor BID, PRN heparin  Labs reviewed and include low sodium (134 mmol/L), elevated BUN (119 mg/dL), elevated creatinine (6.99 mg/dL), low calcium (8.4 mg/dL), elevated phosphorus (10.1 mg/dL), elevated magnesium (2.5 mg/dL) CBG's: 95-110 mg/dL  Diet Order:  Diet NPO time specified  Skin:   (Puncture  left breast), full thickness wound w/ necrosis - malignant  Last BM:  02/20/16  Height:   Ht Readings from Last 1  Encounters:  10/04/14 _0  (1.676 m)    Weight:   Wt Readings from Last 1 Encounters:  02/21/16 (!) 348 lb 15.8 oz (158.3 kg)    Ideal Body Weight:  59.1 kg  BMI:  Body mass index is 56.33 kg/m.  Estimated Nutritional Needs:   Kcal:  1840  Protein:  130-145 g (1.8-2 g/kg ibw)  Fluid:  Per MD  EDUCATION NEEDS:   No education needs identified at this time  Jeb Levering Dietetic Intern Pager Number: [phone]

## 2016-02-22 DIAGNOSIS — I6310 Cerebral infarction due to embolism of unspecified precerebral artery: Secondary | ICD-10-CM

## 2016-02-22 DIAGNOSIS — R40241 Glasgow coma scale score 13-15, unspecified time: Secondary | ICD-10-CM

## 2016-02-22 LAB — RENAL FUNCTION PANEL
Albumin: 2.2 g/dL — ABNORMAL LOW (ref 3.5–5.0)
Anion gap: 23 — ABNORMAL HIGH (ref 5–15)
BUN: 197 mg/dL — AB (ref 6–20)
CALCIUM: 8.9 mg/dL (ref 8.9–10.3)
CHLORIDE: 94 mmol/L — AB (ref 101–111)
CO2: 18 mmol/L — AB (ref 22–32)
CREATININE: 10.5 mg/dL — AB (ref 0.44–1.00)
GFR calc Af Amer: 5 mL/min — ABNORMAL LOW (ref >60)
GFR calc non Af Amer: 4 mL/min — ABNORMAL LOW (ref >60)
GLUCOSE: 104 mg/dL — AB (ref 65–99)
Phosphorus: 15 mg/dL — ABNORMAL HIGH (ref 2.5–4.6)
Potassium: 4.4 mmol/L (ref 3.5–5.1)
SODIUM: 135 mmol/L (ref 135–145)

## 2016-02-22 LAB — CULTURE, BLOOD (ROUTINE X 2)
CULTURE: NO GROWTH
Culture: NO GROWTH

## 2016-02-22 LAB — GLUCOSE, CAPILLARY
GLUCOSE-CAPILLARY: 122 mg/dL — AB (ref 65–99)
GLUCOSE-CAPILLARY: 97 mg/dL (ref 65–99)

## 2016-02-22 MED ORDER — LORAZEPAM BOLUS VIA INFUSION
2.0000 mg | INTRAVENOUS | Status: DC | PRN
  Filled 2016-02-22: qty 5

## 2016-02-22 MED ORDER — MORPHINE BOLUS VIA INFUSION
5.0000 mg | INTRAVENOUS | Status: DC | PRN
  Filled 2016-02-22: qty 20

## 2016-02-22 MED ORDER — LORAZEPAM 2 MG/ML IJ SOLN
5.0000 mg/h | INTRAVENOUS | Status: DC
  Administered 2016-02-22 – 2016-02-23 (×4): 5 mg/h via INTRAVENOUS
  Filled 2016-02-22 (×4): qty 25

## 2016-02-22 MED ORDER — MORPHINE SULFATE 50 MG/ML IV SOLN
10.0000 mg/h | INTRAVENOUS | Status: DC
  Administered 2016-02-22 – 2016-02-23 (×2): 10 mg/h via INTRAVENOUS
  Filled 2016-02-22 (×2): qty 5

## 2016-02-22 NOTE — Progress Notes (Signed)
[facility] Brief Note  Plan for withdrawal of care noted, discussed with Dr. Lake Bells HD cancelled.  [name], MD [facility] [phone] Pager 02/22/2016, 3:53 PM

## 2016-02-22 NOTE — Progress Notes (Signed)
Patient ID: Samantha Pittman, female   DOB: [birth year], 40 y.o.   MRN: [id number]  [facility] Progress Note      Subjective:    For family meeting today Prognosis poor, neuro and onc recommend palliation Have been supporting with HD for ischemic ATN pending a decision Holding off on HD today pending family meeting   Objective:   BP (!) 130/93   Pulse 98   Temp (!) 101.1 F (38.4 C) (Oral)   Resp 13   Wt (!) 158.3 kg (348 lb 15.8 oz)   LMP  (LMP Unknown)   SpO2 100%   BMI 56.33 kg/m   Intake/Output Summary (Last 24 hours) at 02/22/16 0727 Last data filed at 02/22/16 0600  Gross per 24 hour  Intake              960 ml  Output                0 ml  Net              960 ml   Weight change:   Physical Exam: Intubated, unresponsive (no sedation  Contact isolation pending CDiff results R IJ HD cath (11/4) Dynamic precordium S1 and S2 2/6 systolic murmur Coarse BS Abd: Soft, flat, nontender No edema. Legs immobilized.  Imaging: Dg Chest Port 1 View  Result Date: 02/21/2016 CLINICAL DATA:  Intubated patient, acute respiratory failure with ventilated dependence. Acute encephalopathy. Acute CVA. EXAM: PORTABLE CHEST 1 VIEW COMPARISON:  Portable chest x-ray of February 20, 2016 FINDINGS: The lungs are adequately inflated. There is mildly increased density in the left infrahilar region more conspicuous today. The cardiac silhouette is top-normal in size. The pulmonary vascularity is normal. The endotracheal tube tip projects 3.9 cm above the carina. The esophagogastric tube tip projects below the inferior margin of the image. The dual-lumen dialysis catheter tip projects over the midportion of the SVC. IMPRESSION: Interval development of mild subsegmental atelectasis at the left lung base. No alveolar pneumonia nor pleural effusion. The support tubes are in stable position. Electronically Signed   By: David  Martinique M.D.   On: 02/21/2016 07:28   Dg Chest Port 1  View  Result Date: 02/20/2016 CLINICAL DATA:  Hypoxia EXAM: PORTABLE CHEST 1 VIEW COMPARISON:  Study obtained earlier in the day FINDINGS: Endotracheal tube tip is 3.3 cm above the carina. Nasogastric tube tip and side port are in the stomach region. Central catheter tip is in the superior vena cava. No pneumothorax. The consolidation in the right base has resolved with lungs currently clear. Heart size and pulmonary vascularity are normal. No adenopathy. No bone lesions. IMPRESSION: Tube and catheter positions as described without pneumothorax. Lungs now clear. Electronically Signed   By: Lowella Grip III M.D.   On: 02/20/2016 14:34   No labs today 11/11  Recent Labs Lab 02/16/16 0511 02/17/16 0248 02/18/16 0228 02/19/16 0427 02/20/16 0233 02/21/16 0234  NA 139 138 137 137 136 134*  K 4.0 3.8 3.9 4.0 4.3 4.1  CL 97* 97* 95* 97* 96* 96*  CO2 26 26 26  21* 19* 21*  GLUCOSE 133* 111* 114* 104* 122* 102*  BUN 102* 80* 58* 135* 190* 119*  CREATININE 7.50* 5.69* 4.05* 7.81* 10.22* 6.99*  CALCIUM 8.0* 8.4* 8.9 8.9 8.8* 8.4*  PHOS 8.4* 6.3* 5.6* 10.4* 12.3* 10.1*     Recent Labs Lab 02/18/16 0228 02/19/16 0427 02/20/16 0233 02/21/16 0235  WBC 17.6* 15.3* 14.9* 16.9*  NEUTROABS 15.1* 12.2*  13.2* 14.2*  HGB 9.3* 8.0* 7.2* 9.0*  HCT 28.2* 24.3* 21.8* 27.5*  MCV 89.0 88.4 87.9 87.6  PLT 476* 443* 419* 385    Medications:    . sodium chloride   Intravenous Once  . sodium chloride   Intravenous Once  . aspirin EC  325 mg Oral Daily  . chlorhexidine  15 mL Mouth Rinse BID  . famotidine (PEPCID) IV  20 mg Intravenous Q24H  . feeding supplement (NEPRO CARB STEADY)  1,000 mL Per Tube Q24H  . feeding supplement (PRO-STAT SUGAR FREE 64)  60 mL Per Tube TID  . mouth rinse  15 mL Mouth Rinse 10 times per day  . metoprolol tartrate  25 mg Oral BID  . OXcarbazepine  900 mg Per Tube BID   . sodium chloride 10 mL/hr at 02/20/16 1936   Background:  40 y.o. female with CVA/PRES, AKI  d/t ischemic ATN requiring dialysis (initiated 02/16/16). Baseline creatinine not known - 2.64 on admission to outside hospital.     Assessment/ Plan:    1. Acute renal failure:  Hemodynamically mediated  ischemic ATN. No UOP since 11/6. HD dependent since 11/5. Supporting with HD until family can make decision about withdrawal. Await final decisions from today's family meeting re whether or not we will continue.  2. Hypertension: metoprolol + prn hydralazine. 3. Anemia/thrombocytopenia: s/p 2 u PRBC's this adm.  s/p Feraheme for tsat of 5.  4. 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 No plans for systemic treatment per oncology 5. Seizure disorder/altered mentation:  MRI suspicious for PRES. CT increased edema at sites of multiple acute infarcts. Repeat MRI 11/6 new areas of infarct, Remains unresponsive. Decerebrate posturing. EEG no sz activity. Neuro following. Poor prognosis. On oxcarbazepine 6. ID - completed ATB's for S. Viridans bacteremia 7. Diarrhea - CDiff pending. 8. Disposition - DNR, but continuing current level of care. Family meeting today. Holding HD pending that discussion.   Samantha Maes, MD [facility] [phone] Pager 02/22/2016, 7:27 AM

## 2016-02-22 NOTE — Progress Notes (Signed)
[facility] PCCM  S: no acute events  O:  Vitals:   02/22/16 1000 02/22/16 1100 02/22/16 1200 02/22/16 1209  BP: 114/67 124/85 102/61   Pulse: 94 92 92   Resp: 18 (!) 22 18   Temp:    99.9 F (37.7 C)  TempSrc:    Oral  SpO2: 100% 100% 100%   Weight:       Vent Mode: PCV FiO2 (%):  [40 %] 40 % Set Rate:  [15 bmp] 15 bmp PEEP:  [5 cmH20] 5 cmH20 Plateau Pressure:  [15 cmH20-22 cmH20] 19 cmH20  On exam: Gen: on vent HENT: NCAT ETT in place PULM: CTA B, vent supported breaths CV: RRR, no mgr GI: BS+, soft, nontender Neuro: extensor response to touch, pain  Renal records reviewed, holding HD today if plan is for withdrawal Neurology saw last on 11/9, no progress, recommended withdrawal of care, signed off  Impression: Breast cancer Multiple strokes Brainstem edema Persistent encephalopathy due to strokes and brainstem edema  Plan: Case discussed with family this morning.  They understanding given her lack of progress the plan today is to withdraw care.  They have asked that she receive enough pain medicine to prevent any suffering.   -start morphine and ativan infusions -plan palliative extubation at 3PM today after family arrives  My cc time 40 minutes  [name], MD [facility] Pager: [phone] Cell: [phone] After 3pm or if no response, call [phone]

## 2016-02-26 ENCOUNTER — Telehealth: Payer: Self-pay

## 2016-02-26 NOTE — Telephone Encounter (Signed)
On 02/28/16 I received a death certificate from [facility] (original). The death certificate is for cremation. The patient is a patient of Doctor McQuaid. The death certificate will be taken to Pulmonary Unit @ [facility] this morning for signature.  On 2016/02/28 I received the death certificate back from Doctor McQuaid. I got the death certificate ready and called the funeral home to let them know the death certificate is ready for pickup. I also faxed a copy to the funeral home per the funeral home request.

## 2016-03-13 NOTE — Discharge Summary (Signed)
[facility] Death Note  Date of admission: 02-16-2016 Date of Death: 02-28-2016  Cause of death: breast cancer, multiple strokes  HPI and hospital course: This is a 40 y/o female who presented to an outside hospital with weakness and dizziness and inability to see out of her left eye.  She was noted to be hypertensive and eventually she developed unequal pupils and was found to have evidence on brain imaging of a sub-acute infarct.  Her mental status worsened and she was intubated.  She was noted to have midbrain and pons edema and multiple infarcts.  Around this time she was also noted to have developed acute kidney injury requiring dialysis.  Nephrology, Oncology, and and neurology were al consulted in the intensive care unit at [facility].  A left breast biopsy confirmed a diagnosis of breast cancer.  She was monitored in the ICU and her condition did not improve despite treatment with dialysis, life support with mechanical ventilation.  We repeated an MRI later in her hospital stay and she was found to have evidence of worsening multi-focal strokes felt to be of an uncertain embolic phenomena, as well as the persistent edema in the midbrain and pons.  She was monitored with no sedation for several days and did not improve.  Due to her neurologic condition and ESRD (she had no improvement in kidney function) oncology felt she was not a candidate for chemotherapy.  After many conversations with the family they ultimately elected to withdraw care.  She died peacefully with her family at the bedside.  [name], MD [facility] Pager: [phone] Cell: [phone] After 3pm or if no response, call [phone]

## 2016-03-13 NOTE — Progress Notes (Signed)
Pt time of death 1621. Verified by this RN and Drue Stager. No heart sounds, pupils unreactive. Family at bedside. CCM MD notified.

## 2016-03-13 NOTE — Progress Notes (Signed)
150cc morphine gtt wasted in sink with this RN and Electrical engineer. 35cc Ativan gtt wasted in sink witnessed by this RN and Electrical engineer.

## 2016-03-13 NOTE — Progress Notes (Signed)
[facility]  Family Wallis Mart) told nursing yesterday that he wanted to wait until today (11/12) to withdraw care.  He assured the nurse that the plan was to withdraw today.  This was a change in plans compared to the prior conversations I had with him on Thursday and Friday.  Overnight there have been no major issues  We started morphine and ativan yesterday to alleviate any suffering  On exam Lungs clear with vent supported breaths CV: RRR, no mgr Belly soft, nontneder No response to external stimuli  Impression Breast cancer Acute CVA  Brainstem edema Persistent encephalopathy  Plan Withdrawal of care today If family does not show up as planned will need ethics consult Continue full vent support  No charge for today's services  [name], MD [facility] Pager: [phone] Cell: [phone] After 3pm or if no response, call [phone]

## 2016-03-13 NOTE — Procedures (Signed)
Extubation Procedure Note  Patient Details:   Name: Samantha Pittman DOB: 01/03/1976 MRN: [id number]   Airway Documentation:     Evaluation  O2 sats: currently acceptable Complications: No apparent complications Patient did tolerate procedure well. Bilateral Breath Sounds: Rhonchi   No   Patient extubated per "withdrawal of life" protocol.  Patient tolerated well.   Alphia Moh N 03/13/16, 4:08 PM

## 2016-03-13 DEATH — deceased

## 2016-04-23 ENCOUNTER — Telehealth: Payer: Self-pay

## 2016-04-23 NOTE — Telephone Encounter (Signed)
On 04/23/2016 I received a death certificate from Margaretmary Lombard with the [facility] [employer]. The wrong reason of death was on the original death certificate so the death certificate will be taken to [facility] [id number] [facility]) tomorrow am for correction.   On 05/18/16 I received the death certificate back from Doctor McQuaid. I got the death certificate ready and called Margaretmary Lombard to let her know it was ready for pickup.

## 2018-04-08 IMAGING — CR DG CHEST 1V PORT
1 series · 1 of 1 positions shown · non-contrast
Comparison: Portable chest x-ray February 20, 2016

CLINICAL DATA: Intubated patient, acute respiratory failure with
ventilated dependence. Acute encephalopathy. Acute CVA.

EXAM:
PORTABLE CHEST 1 VIEW

[AP]
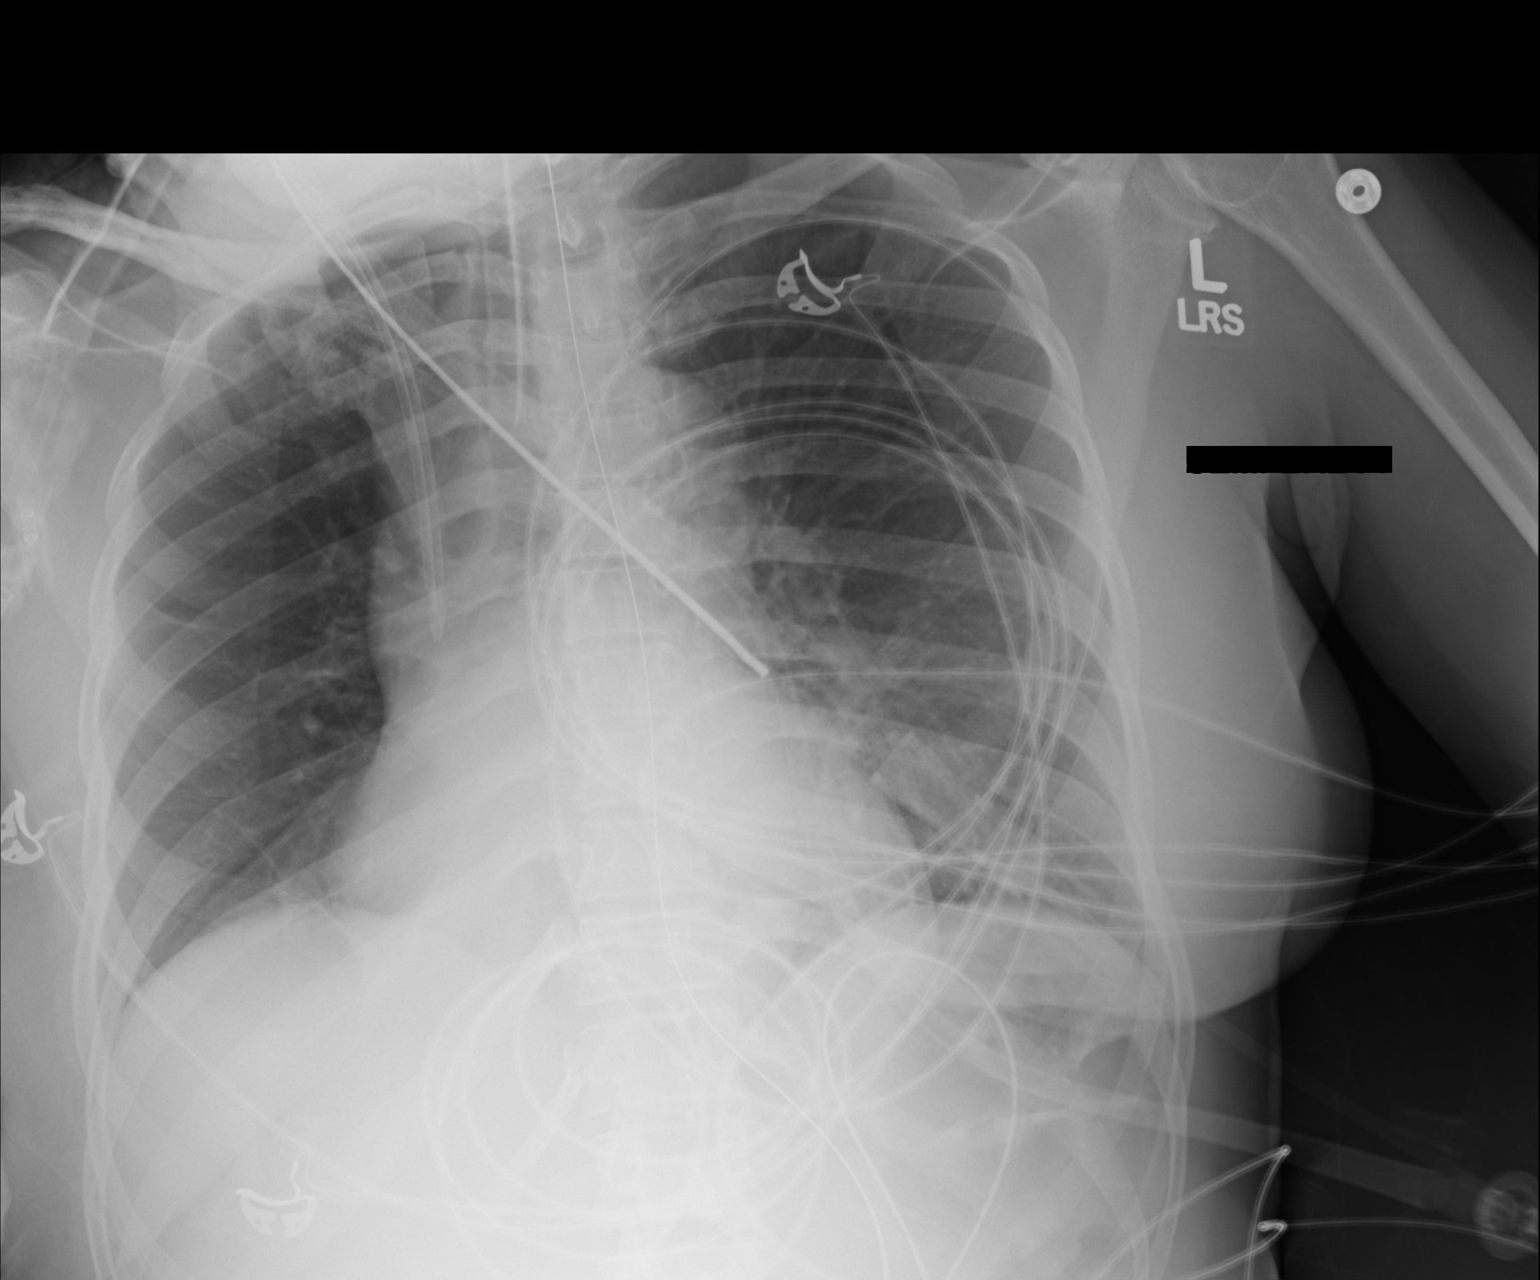

[1 of 1 positions shown; findings below may reference images not displayed]

FINDINGS: The lungs are adequately inflated. There is mildly increased density
in the left infrahilar region more conspicuous today. The cardiac
silhouette is top-normal in size. The pulmonary vascularity is
normal. The endotracheal tube tip projects 3.9 cm above the carina.
The esophagogastric tube tip projects below the inferior margin of
the image. The dual-lumen dialysis catheter tip projects over the
midportion of the SVC.
IMPRESSION: Interval development of mild subsegmental atelectasis at the left
lung base. No alveolar pneumonia nor pleural effusion. The support
tubes are in stable position.
